# Patient Record
Sex: Male | Born: 1980 | Hispanic: Yes | Marital: Single | Smoking: Former smoker
Health system: Southern US, Community
[De-identification: ages and names within clinical notes are randomized; demographics above are authoritative.]

## PROBLEM LIST (undated history)

## (undated) DIAGNOSIS — F1010 Alcohol abuse, uncomplicated: Secondary | ICD-10-CM

## (undated) HISTORY — PX: ABDOMINAL SURGERY: SHX537

---

## 2016-03-31 ENCOUNTER — Encounter: Payer: Self-pay | Admitting: Emergency Medicine

## 2016-03-31 ENCOUNTER — Emergency Department
Admission: EM | Admit: 2016-03-31 | Discharge: 2016-03-31 | Disposition: A | Discharge status: Home / Self Care | Payer: Self-pay | Attending: Emergency Medicine | Admitting: Emergency Medicine

## 2016-03-31 DIAGNOSIS — R42 Dizziness and giddiness: Secondary | ICD-10-CM | POA: Insufficient documentation

## 2016-03-31 DIAGNOSIS — E1165 Type 2 diabetes mellitus with hyperglycemia: Secondary | ICD-10-CM | POA: Insufficient documentation

## 2016-03-31 DIAGNOSIS — R002 Palpitations: Secondary | ICD-10-CM

## 2016-03-31 DIAGNOSIS — E119 Type 2 diabetes mellitus without complications: Secondary | ICD-10-CM

## 2016-03-31 LAB — BASIC METABOLIC PANEL
ANION GAP: 9 (ref 5–15)
BUN: 7 mg/dL (ref 6–20)
CHLORIDE: 103 mmol/L (ref 101–111)
CO2: 24 mmol/L (ref 22–32)
Calcium: 9 mg/dL (ref 8.9–10.3)
Creatinine, Ser: 0.76 mg/dL (ref 0.61–1.24)
GFR calc Af Amer: >60 mL/min (ref >60)
GLUCOSE: 322 mg/dL — AB (ref 65–99)
Potassium: 3.6 mmol/L (ref 3.5–5.1)
Sodium: 136 mmol/L (ref 135–145)

## 2016-03-31 LAB — CBC
HEMATOCRIT: 46.7 % (ref 40.0–52.0)
HEMOGLOBIN: 16.3 g/dL (ref 13.0–18.0)
MCH: 30.8 pg (ref 26.0–34.0)
MCHC: 34.9 g/dL (ref 32.0–36.0)
MCV: 88.4 fL (ref 80.0–100.0)
Platelets: 225 10*3/uL (ref 150–440)
RBC: 5.28 MIL/uL (ref 4.40–5.90)
RDW: 13.2 % (ref 11.5–14.5)
WBC: 7.1 10*3/uL (ref 3.8–10.6)

## 2016-03-31 LAB — URINALYSIS COMPLETE WITH MICROSCOPIC (ARMC ONLY)
BACTERIA UA: NONE SEEN
Bilirubin Urine: NEGATIVE
HGB URINE DIPSTICK: NEGATIVE
Ketones, ur: NEGATIVE mg/dL
Leukocytes, UA: NEGATIVE
NITRITE: NEGATIVE
PH: 6 (ref 5.0–8.0)
Protein, ur: 100 mg/dL — AB
Specific Gravity, Urine: 1.032 — ABNORMAL HIGH (ref 1.005–1.030)
Squamous Epithelial / LPF: NONE SEEN

## 2016-03-31 LAB — GLUCOSE, CAPILLARY: GLUCOSE-CAPILLARY: 203 mg/dL — AB (ref 65–99)

## 2016-03-31 MED ORDER — METFORMIN HCL 500 MG PO TABS: 500.0000 mg | ORAL_TABLET | Freq: Two times a day (BID) | ORAL | Status: DC

## 2016-03-31 NOTE — ED Provider Notes (Signed)
[facility] Emergency Department Provider Note  Time seen: 6:12 PM  I have reviewed the triage vital signs and the nursing notes.   HISTORY  Chief Complaint Palpitations and Dizziness    HPI Todd Grant is a 35 y.o. male with no past medical history who presents to the emergency department palpitations. According to the patient around 3 PM today he felt like his heart was beating irregularly and got lightheaded like he might pass out. Patient denies passing out, denies any chest pain, shortness breath, nausea or sweatiness at any point. States this happened several months ago as well but he never saw anybody about it. Patient denies any symptoms upon arrival to the emergency department. States he feels back to normal.     History reviewed. No pertinent past medical history.  There are no active problems to display for this patient.   Past Surgical History  Procedure Laterality Date  . Abdominal surgery      No current outpatient prescriptions on file.  Allergies Review of patient's allergies indicates no known allergies.  No family history on file.  Social History Social History  Substance Use Topics  . Smoking status: Never Smoker   . Smokeless tobacco: None  . Alcohol Use: No     Comment: stopped 2 years ago    Review of Systems Constitutional: Negative for fever Cardiovascular: Negative for chest pain. Respiratory: Negative for shortness of breath. Gastrointestinal: Negative for abdominal pain Musculoskeletal: Negative for back pain. Neurological: Negative for headache 10-point ROS otherwise negative.  ____________________________________________   PHYSICAL EXAM:  VITAL SIGNS: ED Triage Vitals  Enc Vitals Group     BP 03/31/16 1653 163/102 mmHg     Pulse Rate 03/31/16 1653 108     Resp 03/31/16 1653 16     Temp 03/31/16 1653 98.3 F (36.8 C)     Temp Source 03/31/16 1653 Oral     SpO2 03/31/16 1653 98 %   Weight 03/31/16 1653 189 lb (85.73 kg)     Height 03/31/16 1653 5\' 7"  (1.702 m)     Head Cir --      Peak Flow --      Pain Score 03/31/16 1700 0     Pain Loc --      Pain Edu? --      Excl. in GC? --    Constitutional: Alert and oriented. Well appearing and in no distress. Eyes: Normal exam ENT   Head: Normocephalic and atraumatic.   Mouth/Throat: Mucous membranes are moist. Cardiovascular: Normal rate, regular rhythm. No murmur Respiratory: Normal respiratory effort without tachypnea nor retractions. Breath sounds are clear  Gastrointestinal: Soft and nontender. No distention.  Musculoskeletal: Nontender with normal range of motion in all extremities.  Neurologic:  Normal speech and language. No gross focal neurologic deficits Skin:  Skin is warm, dry and intact.  Psychiatric: Mood and affect are normal. Speech and behavior are normal. ____________________________________________    EKG  EKG reviewed and interpreted by myself shows normal sinus rhythm at 95 bpm, narrow QRS, normal axis, normal intervals, no ST changes. Overall normal EKG.  ____________________________________________   INITIAL IMPRESSION / ASSESSMENT AND PLAN / ED COURSE  Pertinent labs & imaging results that were available during my care of the patient were reviewed by me and considered in my medical decision making (see chart for details).  Patient presents the emergency department with palpitations. Patient's lab work is largely within normal limits besides his blood glucose being elevated.  Patient states he was drinking Pepsi shortly before they checked his blood sugar. However given the patient's palpitations with a significantly elevated blood glucose of 322 have added on a hemoglobin A1c to help rule out diabetes. Patient is agreeable to this plan.  Have added on a hemoglobin A1c. Patient's repeat fingerstick 2 hours or more after last time he took a sip of Pepsi's 203 consistent with diabetes. We  will start the patient on a low-dose metformin, and have him follow up with primary care physician. Patient is agreeable to plan.   ____________________________________________   FINAL CLINICAL IMPRESSION(S) / ED DIAGNOSES  Diabetes hyperglycemia  Minna Antis, MD 03/31/16 2017

## 2016-03-31 NOTE — Discharge Instructions (Signed)
Hyperglycemia °Hyperglycemia occurs when the glucose (sugar) in your blood is too high. Hyperglycemia can happen for many reasons, but it most often happens to people who do not know they have diabetes or are not managing their diabetes properly.  °CAUSES  °Whether you have diabetes or not, there are other causes of hyperglycemia. Hyperglycemia can occur when you have diabetes, but it can also occur in other situations that you might not be as aware of, such as: °Diabetes °· If you have diabetes and are having problems controlling your blood glucose, hyperglycemia could occur because of some of the following reasons: °¨ Not following your meal plan. °¨ Not taking your diabetes medications or not taking it properly. °¨ Exercising less or doing less activity than you normally do. °¨ Being sick. °Pre-diabetes °· This cannot be ignored. Before people develop Type 2 diabetes, they almost always have "pre-diabetes." This is when your blood glucose levels are higher than normal, but not yet high enough to be diagnosed as diabetes. Research has shown that some long-term damage to the body, especially the heart and circulatory system, may already be occurring during pre-diabetes. If you take action to manage your blood glucose when you have pre-diabetes, you may delay or prevent Type 2 diabetes from developing. °Stress °· If you have diabetes, you may be "diet" controlled or on oral medications or insulin to control your diabetes. However, you may find that your blood glucose is higher than usual in the hospital whether you have diabetes or not. This is often referred to as "stress hyperglycemia." Stress can elevate your blood glucose. This happens because of hormones put out by the body during times of stress. If stress has been the cause of your high blood glucose, it can be followed regularly by your caregiver. That way he/she can make sure your hyperglycemia does not continue to get worse or progress to  diabetes. °Steroids °· Steroids are medications that act on the infection fighting system (immune system) to block inflammation or infection. One side effect can be a rise in blood glucose. Most people can produce enough extra insulin to allow for this rise, but for those who cannot, steroids make blood glucose levels go even higher. It is not unusual for steroid treatments to "uncover" diabetes that is developing. It is not always possible to determine if the hyperglycemia will go away after the steroids are stopped. A special blood test called an A1c is sometimes done to determine if your blood glucose was elevated before the steroids were started. °SYMPTOMS °· Thirsty. °· Frequent urination. °· Dry mouth. °· Blurred vision. °· Tired or fatigue. °· Weakness. °· Sleepy. °· Tingling in feet or leg. °DIAGNOSIS  °Diagnosis is made by monitoring blood glucose in one or all of the following ways: °· A1c test. This is a chemical found in your blood. °· Fingerstick blood glucose monitoring. °· Laboratory results. °TREATMENT  °First, knowing the cause of the hyperglycemia is important before the hyperglycemia can be treated. Treatment may include, but is not be limited to: °· Education. °· Change or adjustment in medications. °· Change or adjustment in meal plan. °· Treatment for an illness, infection, etc. °· More frequent blood glucose monitoring. °· Change in exercise plan. °· Decreasing or stopping steroids. °· Lifestyle changes. °HOME CARE INSTRUCTIONS  °· Test your blood glucose as directed. °· Exercise regularly. Your caregiver will give you instructions about exercise. Pre-diabetes or diabetes which comes on with stress is helped by exercising. °· Eat wholesome,   balanced meals. Eat often and at regular, fixed times. Your caregiver or nutritionist will give you a meal plan to guide your sugar intake.  Being at an ideal weight is important. If needed, losing as little as 10 to 15 pounds may help improve blood  glucose levels. SEEK MEDICAL CARE IF:   You have questions about medicine, activity, or diet.  You continue to have symptoms (problems such as increased thirst, urination, or weight gain). SEEK IMMEDIATE MEDICAL CARE IF:   You are vomiting or have diarrhea.  Your breath smells fruity.  You are breathing faster or slower.  You are very sleepy or incoherent.  You have numbness, tingling, or pain in your feet or hands.  You have chest pain.  Your symptoms get worse even though you have been following your caregiver's orders.  If you have any other questions or concerns.   This information is not intended to replace advice given to you by your health care provider. Make sure you discuss any questions you have with your health care provider.   Document Released: 05/11/2001 Document Revised: 02/07/2012 Document Reviewed: 07/22/2015 Elsevier Interactive Patient Education 2016 [employer]  Palpitations A palpitation is the feeling that your heartbeat is irregular. It may feel like your heart is fluttering or skipping a beat. It may also feel like your heart is beating faster than normal. This is usually not a serious problem. In some cases, you may need more medical tests. HOME CARE  Avoid:  Caffeine in coffee, tea, soft drinks, diet pills, and energy drinks.  Chocolate.  Alcohol.  Stop smoking if you smoke.  Reduce your stress and anxiety. Try:  A method that measures bodily functions so you can learn to control them (biofeedback).  Yoga.  Meditation.  Physical activity such as swimming, jogging, or walking.  Get plenty of rest and sleep. GET HELP IF:  Your fast or irregular heartbeat continues after 24 hours.  Your palpitations occur more often. GET HELP RIGHT AWAY IF:   You have chest pain.  You feel short of breath.  You have a very bad headache.  You feel dizzy or pass out (faint). MAKE SURE YOU:   Understand these instructions.  Will watch your  condition.  Will get help right away if you are not doing well or get worse.   This information is not intended to replace advice given to you by your health care provider. Make sure you discuss any questions you have with your health care provider.   Document Released: 08/24/2008 Document Revised: 12/06/2014 Document Reviewed: 01/14/2012 Elsevier Interactive Patient Education [employer] Inc[employer]

## 2016-03-31 NOTE — ED Notes (Signed)
Patient presents to the ED with palpitations and feeling lightheaded and dizzy around 3pm.  Patient states, "I felt like I was going to pass out."  Patient reports continuing to feel slightly dizzy but states the palpitations have resolved.  Patient ambulatory to triage with no obvious distress.  Patient denies chest pain and shortness of breath.

## 2016-04-01 LAB — HEMOGLOBIN A1C: Hgb A1c MFr Bld: 10 % — ABNORMAL HIGH (ref 4.0–6.0)

## 2019-04-23 ENCOUNTER — Emergency Department
Admission: EM | Admit: 2019-04-23 | Discharge: 2019-04-24 | Disposition: A | Discharge status: Home / Self Care | Payer: Self-pay | Attending: Emergency Medicine | Admitting: Emergency Medicine

## 2019-04-23 ENCOUNTER — Encounter: Payer: Self-pay | Admitting: Emergency Medicine

## 2019-04-23 ENCOUNTER — Other Ambulatory Visit: Payer: Self-pay

## 2019-04-23 DIAGNOSIS — Z87891 Personal history of nicotine dependence: Secondary | ICD-10-CM | POA: Insufficient documentation

## 2019-04-23 DIAGNOSIS — F1010 Alcohol abuse, uncomplicated: Secondary | ICD-10-CM | POA: Insufficient documentation

## 2019-04-23 DIAGNOSIS — E119 Type 2 diabetes mellitus without complications: Secondary | ICD-10-CM | POA: Insufficient documentation

## 2019-04-23 LAB — CBC
HCT: 44.1 % (ref 39.0–52.0)
Hemoglobin: 16.1 g/dL (ref 13.0–17.0)
MCH: 33 pg (ref 26.0–34.0)
MCHC: 36.5 g/dL — ABNORMAL HIGH (ref 30.0–36.0)
MCV: 90.4 fL (ref 80.0–100.0)
Platelets: 137 10*3/uL — ABNORMAL LOW (ref 150–400)
RBC: 4.88 MIL/uL (ref 4.22–5.81)
RDW: 11.8 % (ref 11.5–15.5)
WBC: 4 10*3/uL (ref 4.0–10.5)
nRBC: 0 % (ref 0.0–0.2)

## 2019-04-23 LAB — URINE DRUG SCREEN, QUALITATIVE (ARMC ONLY)
Amphetamines, Ur Screen: NOT DETECTED
Barbiturates, Ur Screen: NOT DETECTED
Benzodiazepine, Ur Scrn: NOT DETECTED
Cannabinoid 50 Ng, Ur ~~LOC~~: NOT DETECTED
Cocaine Metabolite,Ur ~~LOC~~: NOT DETECTED
MDMA (Ecstasy)Ur Screen: NOT DETECTED
Methadone Scn, Ur: NOT DETECTED
Opiate, Ur Screen: NOT DETECTED
Phencyclidine (PCP) Ur S: NOT DETECTED
Tricyclic, Ur Screen: NOT DETECTED

## 2019-04-23 LAB — BASIC METABOLIC PANEL
Anion gap: 16 — ABNORMAL HIGH (ref 5–15)
BUN: <5 mg/dL — ABNORMAL LOW (ref 6–20)
CO2: 24 mmol/L (ref 22–32)
Calcium: 9.5 mg/dL (ref 8.9–10.3)
Chloride: 96 mmol/L — ABNORMAL LOW (ref 98–111)
Creatinine, Ser: 0.4 mg/dL — ABNORMAL LOW (ref 0.61–1.24)
GFR calc Af Amer: >60 mL/min (ref >60)
GFR calc non Af Amer: >60 mL/min (ref >60)
Glucose, Bld: 203 mg/dL — ABNORMAL HIGH (ref 70–99)
Potassium: 3.6 mmol/L (ref 3.5–5.1)
Sodium: 136 mmol/L (ref 135–145)

## 2019-04-23 LAB — GLUCOSE, CAPILLARY: Glucose-Capillary: 223 mg/dL — ABNORMAL HIGH (ref 70–99)

## 2019-04-23 LAB — ETHANOL: Alcohol, Ethyl (B): 334 mg/dL (ref <10)

## 2019-04-23 NOTE — ED Triage Notes (Signed)
Pt requesting detox for alcohol. Pt drinks approx. 16 beers per day. Last drink 2 hours ago. Pt sts, "I just feel bad." Pt informed we do not offer detox but will provide resources for him by TTS.

## 2019-04-23 NOTE — ED Notes (Signed)
Hourly rounding reveals patient in room. No complaints, stable, in no acute distress. Q15 minute rounds and monitoring via Rover and Officer to continue.   

## 2019-04-23 NOTE — ED Notes (Signed)
Pt. Transferred from Triage to room after dressing out and screening for contraband. Report to include Situation, Background, Assessment and Recommendations from triage RN. Pt. Oriented to Quad including Q15 minute rounds as well as Psychologist, counselling for their protection. Patient is alert and oriented, warm and dry in no acute distress. Patient denies SI, HI, and AVH. Patient is here for detox. Pt. Encouraged to let me know if needs arise.

## 2019-04-24 ENCOUNTER — Encounter: Payer: Self-pay | Admitting: Emergency Medicine

## 2019-04-24 LAB — HEPATIC FUNCTION PANEL
ALT: 141 U/L — ABNORMAL HIGH (ref 0–44)
AST: 172 U/L — ABNORMAL HIGH (ref 15–41)
Albumin: 4.6 g/dL (ref 3.5–5.0)
Alkaline Phosphatase: 110 U/L (ref 38–126)
Bilirubin, Direct: 0.5 mg/dL — ABNORMAL HIGH (ref 0.0–0.2)
Indirect Bilirubin: 0.8 mg/dL (ref 0.3–0.9)
Total Bilirubin: 1.3 mg/dL — ABNORMAL HIGH (ref 0.3–1.2)
Total Protein: 8.7 g/dL — ABNORMAL HIGH (ref 6.5–8.1)

## 2019-04-24 NOTE — ED Provider Notes (Signed)
[facility] Emergency Department Provider Note   ____________________________________________   First MD Initiated Contact with Patient 04/23/19 2351     (approximate)  I have reviewed the triage vital signs and the nursing notes.   HISTORY  Chief Complaint Alcohol Problem    HPI Todd Grant is a 38 y.o. male patient reports he wants help getting off of alcohol.  Drinks about 16 beers a day last was 2 hours ago.  He says he feels bad.  TTS came by to check on his paperwork and found out he is not taking his diabetes medicines.  RTS will not take him for that reason.  TTS is working on other placement possibilities     History reviewed. No pertinent past medical history.  There are no active problems to display for this patient.   Past Surgical History:  Procedure Laterality Date  . ABDOMINAL SURGERY      Prior to Admission medications   Not on File    Allergies Patient has no known allergies.  History reviewed. No pertinent family history.  Social History Social History   Tobacco Use  . Smoking status: Former Games developer  . Smokeless tobacco: Never Used  Substance Use Topics  . Alcohol use: Yes    Alcohol/week: 16.0 standard drinks    Types: 16 Cans of beer per week  . Drug use: Not on file    Review of Systems  Constitutional: No fever/chills Eyes: No visual changes. ENT: No sore throat. Cardiovascular: Denies chest pain. Respiratory: Denies shortness of breath. Gastrointestinal: No abdominal pain.  No nausea, no vomiting.  No diarrhea.  No constipation. Genitourinary: Negative for dysuria. Musculoskeletal: Negative for back pain. Skin: Negative for rash. Neurological: Negative for headaches, focal weakness   ____________________________________________   PHYSICAL EXAM:  VITAL SIGNS: ED Triage Vitals  Enc Vitals Group     BP 04/23/19 2210 (!) 172/122     Pulse Rate 04/23/19 2210 (!) 120     Resp 04/23/19  2210 (!) 21     Temp 04/23/19 2210 97.9 F (36.6 C)     Temp Source 04/23/19 2210 Oral     SpO2 04/23/19 2210 98 %     Weight --      Height --      Head Circumference --      Peak Flow --      Pain Score 04/23/19 2226 0     Pain Loc --      Pain Edu? --      Excl. in GC? --     Constitutional: Alert and oriented. Well appearing and in no acute distress. Eyes: Conjunctivae are normal.  Head: Atraumatic. Nose: No congestion/rhinnorhea. Mouth/Throat: Mucous membranes are moist.  Oropharynx non-erythematous. Neck: No stridor.   Cardiovascular: Normal rate, regular rhythm. Grossly normal heart sounds.  Good peripheral circulation. Respiratory: Normal respiratory effort.  No retractions. Lungs CTAB. Gastrointestinal: Soft and nontender. No distention. No abdominal bruits. No CVA tenderness. Musculoskeletal: No lower extremity tenderness nor edema.  Neurologic:  Normal speech and language. No gross focal neurologic deficits are appreciated. Skin:  Skin is warm, dry and intact. No rash noted. Psychiatric: Mood and affect are normal. Speech and behavior are normal.  ____________________________________________   LABS (all labs ordered are listed, but only abnormal results are displayed)  Labs Reviewed  GLUCOSE, CAPILLARY - Abnormal; Notable for the following components:      Result Value   Glucose-Capillary 223 (*)    All  other components within normal limits  CBC - Abnormal; Notable for the following components:   MCHC 36.5 (*)    Platelets 137 (*)    All other components within normal limits  BASIC METABOLIC PANEL - Abnormal; Notable for the following components:   Chloride 96 (*)    Glucose, Bld 203 (*)    BUN <5 (*)    Creatinine, Ser 0.40 (*)    Anion gap 16 (*)    All other components within normal limits  ETHANOL - Abnormal; Notable for the following components:   Alcohol, Ethyl (B) 334 (*)    All other components within normal limits  HEPATIC FUNCTION PANEL -  Abnormal; Notable for the following components:   Total Protein 8.7 (*)    AST 172 (*)    ALT 141 (*)    Total Bilirubin 1.3 (*)    Bilirubin, Direct 0.5 (*)    All other components within normal limits  URINE DRUG SCREEN, QUALITATIVE ([facility] ONLY)   ____________________________________________  EKG  ____________________________________________  RADIOLOGY  ED MD interpretation:    Official radiology report(s): No results found.  ____________________________________________   PROCEDURES  Procedure(s) performed (including Critical Care):  Procedures   ____________________________________________   INITIAL IMPRESSION / ASSESSMENT AND PLAN / ED COURSE  Patient no longer wants to wait.  I explained to him about RTS.  TTS is coming by to give him information about [facility] which is where they were going to try and put him but they could not do it till the morning.  He is sober appearing although his alcohol level is very high.  He understands he will need to get a ride home.  We will give him information about [facility] and let him go.              ____________________________________________   FINAL CLINICAL IMPRESSION(S) / ED DIAGNOSES  Final diagnoses:  Alcohol abuse     ED Discharge Orders    None       Note:  This document was prepared using Dragon voice recognition software and may include unintentional dictation errors.    Arnaldo NatalMalinda, Sayda Grable F, MD 04/24/19 [phone]

## 2019-04-24 NOTE — ED Notes (Signed)
Hourly rounding reveals patient in room. No complaints, stable, in no acute distress. Q15 minute rounds and monitoring via Rover and Officer to continue.   

## 2019-04-24 NOTE — Discharge Instructions (Addendum)
Please call [facility] and see if they can help you.  Their information is on the other sheets of paper that we gave you.  Please return for any further problems.

## 2020-06-05 ENCOUNTER — Telehealth: Payer: Self-pay

## 2020-06-05 NOTE — Telephone Encounter (Signed)
Called [facility]mom informing patient of appointment on 06/09/2020. klh 

## 2020-06-09 ENCOUNTER — Ambulatory Visit: Payer: Self-pay | Admitting: Adult Health

## 2020-06-09 ENCOUNTER — Other Ambulatory Visit: Payer: Self-pay

## 2020-06-09 VITALS — BP 141/88 | HR 79 | Temp 97.7°F | Resp 16 | Ht 66.0 in | Wt 152.8 lb

## 2020-06-09 DIAGNOSIS — Z7689 Persons encountering health services in other specified circumstances: Secondary | ICD-10-CM

## 2020-06-09 DIAGNOSIS — E119 Type 2 diabetes mellitus without complications: Secondary | ICD-10-CM

## 2020-06-09 DIAGNOSIS — F320 Major depressive disorder, single episode, mild: Secondary | ICD-10-CM

## 2020-06-09 LAB — POCT GLYCOSYLATED HEMOGLOBIN (HGB A1C): Hemoglobin A1C: 8.1 % — AB (ref 4.0–5.6)

## 2020-06-09 MED ORDER — BUPROPION HCL ER (XL) 150 MG PO TB24: 150.0000 mg | ORAL_TABLET | Freq: Every day | ORAL | 0 refills | Status: AC

## 2020-06-09 MED ORDER — GLIMEPIRIDE 2 MG PO TABS: 2.0000 mg | ORAL_TABLET | Freq: Every day | ORAL | 0 refills | Status: AC

## 2020-06-09 NOTE — Progress Notes (Signed)
[facility] [address] [place], [place] [address]  Internal MEDICINE  Office Visit Note  Patient Name: Todd Grant  [id number]  [id number]  Date of Service: 07/10/2020   Complaints/HPI Pt is here for establishment of PCP. Chief Complaint  Patient presents with  . New Patient (Initial Visit)    per Todd Grant drew clinic they diagnosed pt with diabetes, needs new medication  . Depression    needs new medication for issues   HPI Pt is here to establish care.  He is a well appearing 39 yo Hispanic male.  He currently works as a Education administrator.  He was told in 2017 he has DMII, and has not been treated.  He did stop drinking monster energy drinks, and stopped adding sugar to his cornflakes.  However, he has not followed up with anyone since.    Current Medication: Outpatient Encounter Medications as of 06/09/2020  Medication Sig  . buPROPion (WELLBUTRIN XL) 150 MG 24 hr tablet Take 1 tablet (150 mg total) by mouth daily.  Marland Kitchen glimepiride (AMARYL) 2 MG tablet Take 1 tablet (2 mg total) by mouth daily before breakfast.   No facility-administered encounter medications on file as of 06/09/2020.    Surgical History: Past Surgical History:  Procedure Laterality Date  . ABDOMINAL SURGERY      Medical History: No past medical history on file.  Family History: No family history on file.  Social History   Socioeconomic History  . Marital status: Single    Spouse name: Not on file  . Number of children: Not on file  . Years of education: Not on file  . Highest education level: Not on file  Occupational History  . Not on file  Tobacco Use  . Smoking status: Former Games developer  . Smokeless tobacco: Never Used  Substance and Sexual Activity  . Alcohol use: Yes    Alcohol/week: 16.0 standard drinks    Types: 16 Cans of beer per week  . Drug use: Not on file  . Sexual activity: Not on file  Other Topics Concern  . Not on file  Social History Narrative  . Not on  file   Social Determinants of Health   Financial Resource Strain:   . Difficulty of Paying Living Expenses:   Food Insecurity:   . Worried About Programme researcher, broadcasting/film/video in the Last Year:   . Barista in the Last Year:   Transportation Needs:   . Freight forwarder (Medical):   Marland Kitchen Lack of Transportation (Non-Medical):   Physical Activity:   . Days of Exercise per Week:   . Minutes of Exercise per Session:   Stress:   . Feeling of Stress :   Social Connections:   . Frequency of Communication with Friends and Family:   . Frequency of Social Gatherings with Friends and Family:   . Attends Religious Services:   . Active Member of Clubs or Organizations:   . Attends Banker Meetings:   Marland Kitchen Marital Status:   Intimate Partner Violence:   . Fear of Current or Ex-Partner:   . Emotionally Abused:   Marland Kitchen Physically Abused:   . Sexually Abused:      Review of Systems  Constitutional: Negative.  Negative for chills, fatigue and unexpected weight change.  HENT: Negative.  Negative for congestion, rhinorrhea, sneezing and sore throat.   Eyes: Negative for redness.  Respiratory: Negative.  Negative for cough, chest tightness and shortness of breath.  Cardiovascular: Negative.  Negative for chest pain and palpitations.  Gastrointestinal: Negative.  Negative for abdominal pain, constipation, diarrhea, nausea and vomiting.  Endocrine: Negative.   Genitourinary: Negative.  Negative for dysuria and frequency.  Musculoskeletal: Negative.  Negative for arthralgias, back pain, joint swelling and neck pain.  Skin: Negative.  Negative for rash.  Allergic/Immunologic: Negative.   Neurological: Negative.  Negative for tremors and numbness.  Hematological: Negative for adenopathy. Does not bruise/bleed easily.  Psychiatric/Behavioral: Negative.  Negative for behavioral problems, sleep disturbance and suicidal ideas. The patient is not nervous/anxious.     Vital Signs: BP (!) 141/88    Pulse 79   Temp 97.7 F (36.5 C)   Resp 16   Ht 5\' 6"  (1.676 m)   Wt 152 lb 12.8 oz (69.3 kg)   SpO2 98%   BMI 24.66 kg/m    Physical Exam Vitals and nursing note reviewed.  Constitutional:      General: He is not in acute distress.    Appearance: He is well-developed. He is not diaphoretic.  HENT:     Head: Normocephalic and atraumatic.     Mouth/Throat:     Pharynx: No oropharyngeal exudate.  Eyes:     Pupils: Pupils are equal, round, and reactive to light.  Neck:     Thyroid: No thyromegaly.     Vascular: No JVD.     Trachea: No tracheal deviation.  Cardiovascular:     Rate and Rhythm: Normal rate and regular rhythm.     Heart sounds: Normal heart sounds. No murmur heard.  No friction rub. No gallop.   Pulmonary:     Effort: Pulmonary effort is normal. No respiratory distress.     Breath sounds: Normal breath sounds. No wheezing or rales.  Chest:     Chest wall: No tenderness.  Abdominal:     Palpations: Abdomen is soft.     Tenderness: There is no abdominal tenderness. There is no guarding.  Musculoskeletal:        General: Normal range of motion.     Cervical back: Normal range of motion and neck supple.  Lymphadenopathy:     Cervical: No cervical adenopathy.  Skin:    General: Skin is warm and dry.  Neurological:     Mental Status: He is alert and oriented to person, place, and time.     Cranial Nerves: No cranial nerve deficit.  Psychiatric:        Behavior: Behavior normal.        Thought Content: Thought content normal.        Judgment: Judgment normal.    Assessment/Plan:  1. Encounter to establish care with new doctor Labs given to patient on paper, indigent lab slip.   2. Diabetes mellitus without complication (HCC) A1C today is 8.1.  Discussed lifestyle modifications and will start Amaryl 2mg  daily.  Follow up in 3 months for recheck.  - POCT HgB A1C  3. Depression, major, single episode, mild (HCC) Start wellbutrin as discussed.  Follow  up in 4 weeks for recheck.   General Counseling: Todd Grant verbalizes understanding of the findings of todays visit and agrees with plan of treatment. I have discussed any further diagnostic evaluation that may be needed or ordered today. We also reviewed his medications today. he has been encouraged to call the office with any questions or concerns that should arise related to todays visit.  Orders Placed This Encounter  Procedures  . POCT HgB A1C    Meds  ordered this encounter  Medications  . glimepiride (AMARYL) 2 MG tablet    Sig: Take 1 tablet (2 mg total) by mouth daily before breakfast.    Dispense:  90 tablet    Refill:  0  . buPROPion (WELLBUTRIN XL) 150 MG 24 hr tablet    Sig: Take 1 tablet (150 mg total) by mouth daily.    Dispense:  90 tablet    Refill:  0    Patient has good[employer] cared BIN [id number], PCN [id number], group [id number], member id [id number]    Time spent: 30 Minutes   This patient was seen by Blima Ledger AGNP-C in Collaboration with Dr Lyndon Code as a part of collaborative care agreement  Johnna Acosta AGNP-C Internal Medicine

## 2020-07-08 ENCOUNTER — Telehealth: Payer: Self-pay

## 2020-07-08 NOTE — Telephone Encounter (Signed)
Confirmed office visit for 8/12 

## 2020-07-10 ENCOUNTER — Ambulatory Visit: Payer: Self-pay | Admitting: Hospice and Palliative Medicine

## 2021-06-18 ENCOUNTER — Other Ambulatory Visit: Payer: Self-pay

## 2021-06-18 ENCOUNTER — Encounter: Payer: Self-pay | Admitting: Emergency Medicine

## 2021-06-18 ENCOUNTER — Emergency Department: Payer: Self-pay

## 2021-06-18 ENCOUNTER — Emergency Department
Admission: EM | Admit: 2021-06-18 | Discharge: 2021-06-19 | Disposition: A | Discharge status: Home / Self Care | Payer: Self-pay | Attending: Emergency Medicine | Admitting: Emergency Medicine

## 2021-06-18 DIAGNOSIS — R112 Nausea with vomiting, unspecified: Secondary | ICD-10-CM | POA: Insufficient documentation

## 2021-06-18 DIAGNOSIS — F1010 Alcohol abuse, uncomplicated: Secondary | ICD-10-CM | POA: Insufficient documentation

## 2021-06-18 DIAGNOSIS — Z87891 Personal history of nicotine dependence: Secondary | ICD-10-CM | POA: Insufficient documentation

## 2021-06-18 DIAGNOSIS — Y908 Blood alcohol level of 240 mg/100 ml or more: Secondary | ICD-10-CM | POA: Insufficient documentation

## 2021-06-18 DIAGNOSIS — I10 Essential (primary) hypertension: Secondary | ICD-10-CM

## 2021-06-18 DIAGNOSIS — R4182 Altered mental status, unspecified: Secondary | ICD-10-CM | POA: Insufficient documentation

## 2021-06-18 DIAGNOSIS — Z20822 Contact with and (suspected) exposure to covid-19: Secondary | ICD-10-CM | POA: Insufficient documentation

## 2021-06-18 HISTORY — DX: Alcohol abuse, uncomplicated: F10.10

## 2021-06-18 LAB — TYPE AND SCREEN
ABO/RH(D): O POS
Antibody Screen: NEGATIVE

## 2021-06-18 LAB — COMPREHENSIVE METABOLIC PANEL
ALT: 40 U/L (ref 0–44)
AST: 97 U/L — ABNORMAL HIGH (ref 15–41)
Albumin: 4 g/dL (ref 3.5–5.0)
Alkaline Phosphatase: 146 U/L — ABNORMAL HIGH (ref 38–126)
Anion gap: 14 (ref 5–15)
BUN: <5 mg/dL — ABNORMAL LOW (ref 6–20)
CO2: 24 mmol/L (ref 22–32)
Calcium: 8.8 mg/dL — ABNORMAL LOW (ref 8.9–10.3)
Chloride: 97 mmol/L — ABNORMAL LOW (ref 98–111)
Creatinine, Ser: 0.4 mg/dL — ABNORMAL LOW (ref 0.61–1.24)
GFR, Estimated: >60 mL/min (ref >60)
Glucose, Bld: 195 mg/dL — ABNORMAL HIGH (ref 70–99)
Potassium: 3.3 mmol/L — ABNORMAL LOW (ref 3.5–5.1)
Sodium: 135 mmol/L (ref 135–145)
Total Bilirubin: 3 mg/dL — ABNORMAL HIGH (ref 0.3–1.2)
Total Protein: 8.6 g/dL — ABNORMAL HIGH (ref 6.5–8.1)

## 2021-06-18 LAB — LIPASE, BLOOD: Lipase: 131 U/L — ABNORMAL HIGH (ref 11–51)

## 2021-06-18 LAB — TROPONIN I (HIGH SENSITIVITY): Troponin I (High Sensitivity): 6 ng/L (ref <18)

## 2021-06-18 LAB — MAGNESIUM: Magnesium: 1.7 mg/dL (ref 1.7–2.4)

## 2021-06-18 LAB — ETHANOL: Alcohol, Ethyl (B): 417 mg/dL (ref <10)

## 2021-06-18 LAB — CBG MONITORING, ED: Glucose-Capillary: 205 mg/dL — ABNORMAL HIGH (ref 70–99)

## 2021-06-18 MED ORDER — OCTREOTIDE LOAD VIA INFUSION
50.0000 ug | Freq: Once | INTRAVENOUS | Status: DC
  Filled 2021-06-18: qty 25

## 2021-06-18 MED ORDER — SODIUM CHLORIDE 0.9 % IV BOLUS
1000.0000 mL | Freq: Once | INTRAVENOUS | Status: AC
  Administered 2021-06-18: 1000 mL via INTRAVENOUS

## 2021-06-18 MED ORDER — SODIUM CHLORIDE 0.9 % IV SOLN
50.0000 ug/h | INTRAVENOUS | Status: DC
  Filled 2021-06-18 (×2): qty 1

## 2021-06-18 MED ORDER — PANTOPRAZOLE SODIUM 40 MG IV SOLR
40.0000 mg | Freq: Once | INTRAVENOUS | Status: AC
  Administered 2021-06-18: 40 mg via INTRAVENOUS
  Filled 2021-06-18: qty 40

## 2021-06-18 MED ORDER — ONDANSETRON HCL 4 MG/2ML IJ SOLN
4.0000 mg | Freq: Once | INTRAMUSCULAR | Status: AC
  Administered 2021-06-18: 4 mg via INTRAVENOUS
  Filled 2021-06-18: qty 2

## 2021-06-18 MED ORDER — IOHEXOL 350 MG/ML SOLN
100.0000 mL | Freq: Once | INTRAVENOUS | Status: AC | PRN
  Administered 2021-06-18: 100 mL via INTRAVENOUS

## 2021-06-18 NOTE — ED Notes (Signed)
PT in CT.

## 2021-06-18 NOTE — ED Notes (Signed)
Pt provided a patient phone to call his family.

## 2021-06-18 NOTE — ED Triage Notes (Signed)
Pt in via POV, reports here for detox, also reports hematemesis x approximately one week.  Reports last drink approximately 2 hours ago, reports only having 2 24oz beers today.  Pt with difficulty staying awake in triage.  Sinus Tach, hypertensive upon arrival.

## 2021-06-18 NOTE — ED Provider Notes (Addendum)
[facility] Emergency Department Provider Note  ____________________________________________   Event Date/Time   First MD Initiated Contact with Patient 06/18/21 2227     (approximate)  I have reviewed the triage vital signs and the nursing notes.   HISTORY  Chief Complaint Hematemesis    HPI Todd Grant is a 40 y.o. male with alcohol abuse who comes in with concerns for detox.  Patient reports that he started having vomiting o1 week ago.  Initially that there was blood in it.  Intermittent, unclear how much, brought on by drinking, nothing made it better. States that he still had some vomiting but denies any blood currently.  Reports the last drink 2 hours ago.  States that he does drink daily.  He states that he has never had seizures with withdrawal.  Denies any SI.  He denies any falls.  Denies any abdominal pain.       Past Medical History:  Diagnosis Date   ETOH abuse     There are no problems to display for this patient.   Past Surgical History:  Procedure Laterality Date   ABDOMINAL SURGERY      Prior to Admission medications   Medication Sig Start Date End Date Taking? Authorizing Provider  buPROPion (WELLBUTRIN XL) 150 MG 24 hr tablet Take 1 tablet (150 mg total) by mouth daily. 06/09/20   Johnna Acosta, NP  glimepiride (AMARYL) 2 MG tablet Take 1 tablet (2 mg total) by mouth daily before breakfast. 06/09/20   Johnna Acosta, NP    Allergies Patient has no known allergies.  No family history on file.  Social History Social History   Tobacco Use   Smoking status: Former    Types: Cigarettes   Smokeless tobacco: Never  Vaping Use   Vaping Use: Never used  Substance Use Topics   Alcohol use: Yes    Alcohol/week: 16.0 standard drinks    Types: 16 Cans of beer per week   Drug use: Never      Review of Systems Constitutional: No fever/chills +alcohol use  Eyes: No visual changes. ENT: No sore  throat. Cardiovascular: Denies chest pain. Respiratory: Denies shortness of breath. Gastrointestinal: +vomiting fluid +nausea  Genitourinary: Negative for dysuria. Musculoskeletal: Negative for back pain. Skin: Negative for rash. Neurological: Negative for headaches, focal weakness or numbness. All other ROS negative ____________________________________________   PHYSICAL EXAM:  VITAL SIGNS: ED Triage Vitals  Enc Vitals Group     BP 06/18/21 2145 (!) 151/113     Pulse Rate 06/18/21 2145 (!) 126     Resp 06/18/21 2145 16     Temp 06/18/21 2145 99 F (37.2 C)     Temp src --      SpO2 06/18/21 2145 94 %     Weight 06/18/21 2151 155 lb (70.3 kg)     Height 06/18/21 2151 5' 1.42" (1.56 m)     Head Circumference --      Peak Flow --      Pain Score 06/18/21 2151 0     Pain Loc --      Pain Edu? --      Excl. in GC? --     Constitutional: Slightly slurred speech.  Appears intoxicated Eyes: Conjunctivae are normal. EOMI. Head: Atraumatic. Nose: No congestion/rhinnorhea. Mouth/Throat: Mucous membranes are moist.   Neck: No stridor. Trachea Midline. FROM Cardiovascular: tachycardiac, regular rhythm. Grossly normal heart sounds.  Good peripheral circulation. Respiratory: Normal respiratory effort.  No  retractions. Lungs CTAB. Gastrointestinal: Soft and nontender. No distention. No abdominal bruits.  Musculoskeletal: No lower extremity tenderness nor edema.  No joint effusions. Neurologic:  Normal speech and language. No gross focal neurologic deficits are appreciated.  Skin:  Skin is warm, dry and intact. No rash noted. Psychiatric: Mood and affect are normal. Speech and behavior are normal. GU: Deferred   ____________________________________________   LABS (all labs ordered are listed, but only abnormal results are displayed)  Labs Reviewed  ETHANOL - Abnormal; Notable for the following components:      Result Value   Alcohol, Ethyl (B) 417 (*)    All other components  within normal limits  COMPREHENSIVE METABOLIC PANEL - Abnormal; Notable for the following components:   Potassium 3.3 (*)    Chloride 97 (*)    Glucose, Bld 195 (*)    BUN <5 (*)    Creatinine, Ser 0.40 (*)    Calcium 8.8 (*)    Total Protein 8.6 (*)    AST 97 (*)    Alkaline Phosphatase 146 (*)    Total Bilirubin 3.0 (*)    All other components within normal limits  LIPASE, BLOOD - Abnormal; Notable for the following components:   Lipase 131 (*)    All other components within normal limits  CBC WITH DIFFERENTIAL/PLATELET  URINALYSIS, COMPLETE (UACMP) WITH MICROSCOPIC  CBG MONITORING, ED  TYPE AND SCREEN  TROPONIN I (HIGH SENSITIVITY)   ____________________________________________   ED ECG REPORT I, Concha Se, the attending physician, personally viewed and interpreted this ECG.  Sinus tachycardia rate of 123, no ST elevation, T wave version lead III, normal intervals ____________________________________________  RADIOLOGY Vela Prose, personally viewed and evaluated these images (plain radiographs) as part of my medical decision making, as well as reviewing the written report by the radiologist.  ED MD interpretation:  pending   Official radiology report(s): No results found.  ____________________________________________   PROCEDURES  Procedure(s) performed (including Critical Care):  .1-3 Lead EKG Interpretation  Date/Time: 06/18/2021 10:51 PM Performed by: Concha Se, MD Authorized by: Concha Se, MD     Interpretation: abnormal     ECG rate:  120s   ECG rate assessment: tachycardic     Rhythm: sinus rhythm     Ectopy: none     Conduction: normal     ____________________________________________   INITIAL IMPRESSION / ASSESSMENT AND PLAN / ED COURSE  Todd Grant was evaluated in Emergency Department on 06/18/2021 for the symptoms described in the history of present illness. He was evaluated in the context of the global COVID-19  pandemic, which necessitated consideration that the patient might be at risk for infection with the SARS-CoV-2 virus that causes COVID-19. Institutional protocols and algorithms that pertain to the evaluation of patients at risk for COVID-19 are in a state of rapid change based on information released by regulatory bodies including the CDC and federal and state organizations. These policies and algorithms were followed during the patient's care in the ED.    Patient keeps changing his story and so is hard to know if he is actually been having any vomiting with blood recently or if that was a week ago.  I did do a rectal exam that had brown stool and was Hemoccult negative.  Just to be safe however we will cover him with Protonix and octreotide while pending work-up.  We will give 1 L of fluid for his tachycardia.  Labs ordered to evaluate for  Electra abnormalities, AKI.  Will keep patient the cardiac monitor due to his tachycardia.  Patient's alcohol level came back significantly elevated.  We will hang patient off to the oncoming team pending labs and reevaluation  Will get CTA to look for any evidence of active bleeding and a CT head given patient is intermittently falling asleep but I suspect that this is most likely just from alcohol abuse.      ____________________________________________   FINAL CLINICAL IMPRESSION(S) / ED DIAGNOSES   Final diagnoses:  Alcohol abuse  Nausea and vomiting, intractability of vomiting not specified, unspecified vomiting type      MEDICATIONS GIVEN DURING THIS VISIT:  Medications  pantoprazole (PROTONIX) injection 40 mg (has no administration in time range)  octreotide (SANDOSTATIN) 2 mcg/mL load via infusion 50 mcg (has no administration in time range)    And  octreotide (SANDOSTATIN) 500 mcg in sodium chloride 0.9 % 250 mL (2 mcg/mL) infusion (has no administration in time range)  sodium chloride 0.9 % bolus 1,000 mL (has no administration in time  range)  ondansetron (ZOFRAN) injection 4 mg (has no administration in time range)     ED Discharge Orders     None        Note:  This document was prepared using Dragon voice recognition software and may include unintentional dictation errors.    Concha Se, MD 06/18/21 2255    Concha Se, MD 06/18/21 [phone]

## 2021-06-19 LAB — CBC WITH DIFFERENTIAL/PLATELET
Abs Immature Granulocytes: 0.01 10*3/uL (ref 0.00–0.07)
Basophils Absolute: 0 10*3/uL (ref 0.0–0.1)
Basophils Relative: 0 %
Eosinophils Absolute: 0 10*3/uL (ref 0.0–0.5)
Eosinophils Relative: 1 %
HCT: 39.6 % (ref 39.0–52.0)
Hemoglobin: 14.8 g/dL (ref 13.0–17.0)
Immature Granulocytes: 0 %
Lymphocytes Relative: 39 %
Lymphs Abs: 2.3 10*3/uL (ref 0.7–4.0)
MCH: 35.2 pg — ABNORMAL HIGH (ref 26.0–34.0)
MCHC: 37.4 g/dL — ABNORMAL HIGH (ref 30.0–36.0)
MCV: 94.1 fL (ref 80.0–100.0)
Monocytes Absolute: 0.5 10*3/uL (ref 0.1–1.0)
Monocytes Relative: 8 %
Neutro Abs: 3.1 10*3/uL (ref 1.7–7.7)
Neutrophils Relative %: 52 %
Platelets: 58 10*3/uL — ABNORMAL LOW (ref 150–400)
RBC: 4.21 MIL/uL — ABNORMAL LOW (ref 4.22–5.81)
RDW: 12.4 % (ref 11.5–15.5)
Smear Review: NORMAL
WBC: 6 10*3/uL (ref 4.0–10.5)
nRBC: 0 % (ref 0.0–0.2)

## 2021-06-19 LAB — URINE DRUG SCREEN, QUALITATIVE (ARMC ONLY)
Amphetamines, Ur Screen: NOT DETECTED
Barbiturates, Ur Screen: NOT DETECTED
Benzodiazepine, Ur Scrn: NOT DETECTED
Cannabinoid 50 Ng, Ur ~~LOC~~: NOT DETECTED
Cocaine Metabolite,Ur ~~LOC~~: NOT DETECTED
MDMA (Ecstasy)Ur Screen: NOT DETECTED
Methadone Scn, Ur: NOT DETECTED
Opiate, Ur Screen: NOT DETECTED
Phencyclidine (PCP) Ur S: NOT DETECTED
Tricyclic, Ur Screen: NOT DETECTED

## 2021-06-19 LAB — URINALYSIS, COMPLETE (UACMP) WITH MICROSCOPIC
Bacteria, UA: NONE SEEN
Bilirubin Urine: NEGATIVE
Glucose, UA: NEGATIVE mg/dL
Hgb urine dipstick: NEGATIVE
Ketones, ur: NEGATIVE mg/dL
Leukocytes,Ua: NEGATIVE
Nitrite: NEGATIVE
Protein, ur: NEGATIVE mg/dL
Specific Gravity, Urine: 1.02 (ref 1.005–1.030)
Squamous Epithelial / LPF: NONE SEEN (ref 0–5)
pH: 7 (ref 5.0–8.0)

## 2021-06-19 LAB — RESP PANEL BY RT-PCR (FLU A&B, COVID) ARPGX2
Influenza A by PCR: NEGATIVE
Influenza B by PCR: NEGATIVE
SARS Coronavirus 2 by RT PCR: NEGATIVE

## 2021-06-19 MED ORDER — LORAZEPAM 2 MG/ML IJ SOLN
0.0000 mg | Freq: Four times a day (QID) | INTRAMUSCULAR | Status: DC
  Filled 2021-06-19: qty 1

## 2021-06-19 MED ORDER — LORAZEPAM 2 MG/ML IJ SOLN
1.0000 mg | Freq: Once | INTRAMUSCULAR | Status: AC
  Administered 2021-06-19: 1 mg via INTRAVENOUS
  Filled 2021-06-19: qty 1

## 2021-06-19 MED ORDER — THIAMINE HCL 100 MG PO TABS
100.0000 mg | ORAL_TABLET | Freq: Every day | ORAL | Status: DC
  Administered 2021-06-19: 100 mg via ORAL
  Filled 2021-06-19: qty 1

## 2021-06-19 MED ORDER — PANTOPRAZOLE SODIUM 40 MG PO TBEC
40.0000 mg | DELAYED_RELEASE_TABLET | Freq: Every day | ORAL | Status: DC
  Administered 2021-06-19: 40 mg via ORAL
  Filled 2021-06-19: qty 1

## 2021-06-19 MED ORDER — PANTOPRAZOLE SODIUM 40 MG PO TBEC: 40.0000 mg | DELAYED_RELEASE_TABLET | Freq: Every day | ORAL | 1 refills | Status: AC

## 2021-06-19 MED ORDER — LORAZEPAM 2 MG PO TABS
0.0000 mg | ORAL_TABLET | Freq: Four times a day (QID) | ORAL | Status: DC
  Administered 2021-06-19: 1 mg via ORAL
  Filled 2021-06-19: qty 1

## 2021-06-19 MED ORDER — AMLODIPINE BESYLATE 5 MG PO TABS
5.0000 mg | ORAL_TABLET | Freq: Every day | ORAL | Status: DC
  Administered 2021-06-19 (×2): 5 mg via ORAL
  Filled 2021-06-19 (×2): qty 1

## 2021-06-19 MED ORDER — LORAZEPAM 2 MG/ML IJ SOLN: 0.0000 mg | Freq: Two times a day (BID) | INTRAMUSCULAR | Status: DC

## 2021-06-19 MED ORDER — LORAZEPAM 2 MG PO TABS: 0.0000 mg | ORAL_TABLET | Freq: Two times a day (BID) | ORAL | Status: DC

## 2021-06-19 MED ORDER — GLIMEPIRIDE 2 MG PO TABS
2.0000 mg | ORAL_TABLET | Freq: Every day | ORAL | Status: DC
  Administered 2021-06-19: 2 mg via ORAL
  Filled 2021-06-19 (×2): qty 1

## 2021-06-19 MED ORDER — AMLODIPINE BESYLATE 5 MG PO TABS: 5.0000 mg | ORAL_TABLET | Freq: Every day | ORAL | 1 refills | Status: AC

## 2021-06-19 MED ORDER — SODIUM CHLORIDE 0.9 % IV SOLN: INTRAVENOUS | Status: AC

## 2021-06-19 MED ORDER — THIAMINE HCL 100 MG/ML IJ SOLN
100.0000 mg | Freq: Every day | INTRAMUSCULAR | Status: DC
  Filled 2021-06-19: qty 2

## 2021-06-19 MED ORDER — BUPROPION HCL ER (XL) 150 MG PO TB24
150.0000 mg | ORAL_TABLET | Freq: Every day | ORAL | Status: DC
  Administered 2021-06-19: 150 mg via ORAL
  Filled 2021-06-19: qty 1

## 2021-06-19 MED ORDER — ONDANSETRON 4 MG PO TBDP: 4.0000 mg | ORAL_TABLET | Freq: Three times a day (TID) | ORAL | Status: DC | PRN

## 2021-06-19 NOTE — Discharge Instructions (Addendum)
No driving. Please have your family take you to [facility] in [place] tonight to get ongoing alcohol treatment.  Steps to find a Primary Care Provider (PCP):  Call [phone] or [phone] to access "[facility] Find a Doctor Service."  2.  You may also go on the [facility] website at [email or web address]

## 2021-06-19 NOTE — ED Notes (Signed)
E-signature pad unavailable - Pt verbalized understanding of D/C information - no additional concerns at this time.  

## 2021-06-19 NOTE — ED Provider Notes (Signed)
  Physical Exam  BP (!) 139/103   Pulse (!) 107   Temp 99 F (37.2 C)   Resp (!) 25   Ht 5' 1.42" (1.56 m)   Wt 70.3 kg   SpO2 94%   BMI 28.89 kg/m   Physical Exam  ED Course/Procedures     Procedures  MDM  12:15 AM  Assumed care at shift change.  Patient is a 40 year old male with history of alcohol abuse who presents to the emergency department with intermittent hematemesis over the past week.  Patient is a very poor historian.  No melena and is guaiac negative here.  Slightly tachycardic which may be due to intoxication but otherwise hemodynamically stable.  Hemoglobin 14.8.  Alcohol level in the 400s.  CTA of the abdomen pelvis showed no sign of active bleeding.  He does have sign of cirrhosis and gallstones.  Abdominal exam is benign.  Will po challenge and continue to monitor for any signs of bleeding.  Patient also requesting detox.  We will place him on CIWA protocol.  12:30 AM  Pt tolerated p.o. well and has not had any further vomiting.  States his last episode of vomiting was about 24 hours ago.  He describes it as seeing about a teaspoonful of blood with his vomit.  He states he has vomited a total of 3 times in the past week.  Denies bloody stool, melena.  Denies abdominal pain.  Still requesting detox.  Denies SI, HI.  I suspect that this could be a Mallory-Weiss tear but I do not think that he has clinically significant bleeding esophageal varices, peptic ulcer, upper GI bleed that warrants medical admission.  We will continue Protonix.  Will consult TTS given he is requesting detox.  3:30 AM  TTS has evaluated the patient and will try to get him to [facility] in the AM.   I reviewed all nursing notes and pertinent previous records as available.  I have reviewed and interpreted any EKGs, lab and urine results, imaging (as available).        Elenor Wildes, Layla Maw, DO 06/19/21 [phone]

## 2021-06-19 NOTE — BH Assessment (Signed)
Referral information for Detox treatment faxed to;   [facility] ( [phone]) Meg reports that beds are available later this morning

## 2021-06-19 NOTE — ED Provider Notes (Signed)
Notified that patient has a treatment bed at [facility] but he is currently awaiting discussion with his family to assure that if he goes there he did have a ride home after completing treatment.  Calvin from our TTS team and stop I am informing of this.  I went and saw the patient himself he has some mild tremulousness, discussed with him and vital signs are stable his CIWA score approximating 5.  He would like a little additional treatment for his shakes and he is fully awake and alert at this time but shows tremulousness in both upper extremities.  Evidently ordered 1 mg of IV Ativan for ongoing alcohol withdrawal treatment at this time.  Patient agreeable   Sharyn Creamer, MD 06/19/21 [phone]

## 2021-06-19 NOTE — ED Provider Notes (Signed)
Vitals:   06/19/21 1254 06/19/21 1700  BP: (!) 126/96 (!) 136/97  Pulse: 84 81  Resp: 16   Temp:    SpO2: 97% 98%     Patient resting in no distress.  Fully alert without difficulty.  He is understanding and comfortable with the plan for discharge and going direct to [facility] in [facility].  Patient reports his mother will be here just after 7 PM to pick him up and then they will be going to [facility] for ongoing outpatient alcohol abuse therapy.  He knows not to drive is fully awake and alert and expresses plan to go direct to treatment with his mother driving him.  Patient appropriate for discharge with anticipated plan that he will present to [facility] for ongoing treatment    Sharyn Creamer, MD 06/19/21 1853

## 2021-06-19 NOTE — BH Assessment (Signed)
Comprehensive Clinical Assessment (CCA) Note  06/19/2021 Todd Grant [id number]  Chief Complaint: Patient is a 40 year old male presenting to [facility] ED voluntarily seeking detox treatment. Patient reports that he has been drinking daily for the past 2 weeks "at least 12 beers sometimes more every day." Patient reports his age of first use at 39 years old. Patient reports current withdrawal symptoms "vomiting, chills, sweats, shaking, anxiety." Patient reports he last drank "last night" and reports drinking 3 40oz beers during that time. Patient denies SI/HI/AH/VH and does not appear to be responding to any internal or external stimuli.  Chief Complaint  Patient presents with   Hematemesis   Visit Diagnosis: Alcohol Use Disorder, severe    CCA Screening, Triage and Referral (STR)  Patient Reported Information How did you hear about [place]? Self  Referral name: No data recorded Referral phone number: No data recorded  Whom do you see for routine medical problems? No data recorded Practice/Facility Name: No data recorded Practice/Facility Phone Number: No data recorded Name of Contact: No data recorded Contact Number: No data recorded Contact Fax Number: No data recorded Prescriber Name: No data recorded Prescriber Address (if known): No data recorded  What Is the Reason for Your Visit/Call Today? Patient here voluntarily for detox treatment  How Long Has This Been Causing You Problems? > than 6 months  What Do You Feel Would Help You the Most Today? Alcohol or Drug Use Treatment   Have You Recently Been in Any Inpatient Treatment (Hospital/Detox/Crisis Center/28-Day Program)? No data recorded Name/Location of Program/Hospital:No data recorded How Long Were You There? No data recorded When Were You Discharged? No data recorded  Have You Ever Received Services From [facility] Before? No data recorded Who Do You See at [facility]? No data recorded  Have You Recently Had  Any Thoughts About Hurting Yourself? No  Are You Planning to Commit Suicide/Harm Yourself At This time? No   Have you Recently Had Thoughts About Hurting Someone Karolee Ohs? No  Explanation: No data recorded  Have You Used Any Alcohol or Drugs in the Past 24 Hours? Yes  How Long Ago Did You Use Drugs or Alcohol? No data recorded What Did You Use and How Much? Alcohol, 3 40oz beers   Do You Currently Have a Therapist/Psychiatrist? No  Name of Therapist/Psychiatrist: No data recorded  Have You Been Recently Discharged From Any Office Practice or Programs? No  Explanation of Discharge From Practice/Program: No data recorded    CCA Screening Triage Referral Assessment Type of Contact: Face-to-Face  Is this Initial or Reassessment? No data recorded Date Telepsych consult ordered in [facility]:  No data recorded Time Telepsych consult ordered in [facility]:  No data recorded  Patient Reported Information Reviewed? No data recorded Patient Left Without Being Seen? No data recorded Reason for Not Completing Assessment: No data recorded  Collateral Involvement: No data recorded  Does Patient Have a Court Appointed Legal Guardian? No data recorded Name and Contact of Legal Guardian: No data recorded If Minor and Not Living with Parent(s), Who has Custody? No data recorded Is CPS involved or ever been involved? Never  Is APS involved or ever been involved? Never   Patient Determined To Be At Risk for Harm To Self or Others Based on Review of Patient Reported Information or Presenting Complaint? No  Method: No data recorded Availability of Means: No data recorded Intent: No data recorded Notification Required: No data recorded Additional Information for Danger to Others Potential: No  data recorded Additional Comments for Danger to Others Potential: No data recorded Are There Guns or Other Weapons in Your Home? No data recorded Types of Guns/Weapons: No data recorded Are These Weapons Safely  Secured?                            No data recorded Who Could Verify You Are Able To Have These Secured: No data recorded Do You Have any Outstanding Charges, Pending Court Dates, Parole/Probation? No data recorded Contacted To Inform of Risk of Harm To Self or Others: No data recorded  Location of Assessment: [facility] ED   Does Patient Present under Involuntary Commitment? No  IVC Papers Initial File Date: No data recorded  [place] of Residence: [place]   Patient Currently Receiving the Following Services: No data recorded  Determination of Need: Emergent (2 hours)   Options For Referral: No data recorded    CCA Biopsychosocial Intake/Chief Complaint:  No data recorded Current Symptoms/Problems: No data recorded  Patient Reported Schizophrenia/Schizoaffective Diagnosis in Past: No   Strengths: Patient is able to communicate  Preferences: No data recorded Abilities: No data recorded  Type of Services Patient Feels are Needed: No data recorded  Initial Clinical Notes/Concerns: No data recorded  Mental Health Symptoms Depression:   None   Duration of Depressive symptoms: No data recorded  Mania:   None   Anxiety:    Tension; Sleep; Difficulty concentrating   Psychosis:   None   Duration of Psychotic symptoms: No data recorded  Trauma:   None   Obsessions:   None   Compulsions:   None   Inattention:   None   Hyperactivity/Impulsivity:   None   Oppositional/Defiant Behaviors:   None   Emotional Irregularity:   None   Other Mood/Personality Symptoms:  No data recorded   Mental Status Exam Appearance and self-care  Stature:   Average   Weight:   Average weight   Clothing:   Casual   Grooming:   Normal   Cosmetic use:   None   Posture/gait:   Normal   Motor activity:   Not Remarkable   Sensorium  Attention:   Normal   Concentration:   Normal   Orientation:   X5   Recall/memory:   Normal   Affect and Mood  Affect:    Appropriate   Mood:   Anxious   Relating  Eye contact:   Normal   Facial expression:   Responsive   Attitude toward examiner:   Cooperative   Thought and Language  Speech flow:  Clear and Coherent   Thought content:   Appropriate to Mood and Circumstances   Preoccupation:   None   Hallucinations:   None   Organization:  No data recorded  [employer] of Knowledge:   Fair   Intelligence:   Average   Abstraction:   Normal   Judgement:   Good   Reality Testing:   Realistic   Insight:   Good   Decision Making:   Normal   Social Functioning  Social Maturity:   Responsible   Social Judgement:   Normal   Stress  Stressors:   Office manager Ability:   Normal   Skill Deficits:   None   Supports:   Family     Religion: Religion/Spirituality Are You A Religious Person?: No  Leisure/Recreation: Leisure / Recreation Do You Have Hobbies?: No  Exercise/Diet: Exercise/Diet Do  You Exercise?: No Have You Gained or Lost A Significant Amount of Weight in the Past Six Months?: No Do You Follow a Special Diet?: No Do You Have Any Trouble Sleeping?: Yes Explanation of Sleeping Difficulties: Patient reports difficulty sleeping   CCA Employment/Education Employment/Work Situation: Employment / Work Situation Employment Situation: Unemployed Patient's Job has Been Impacted by Current Illness: No Has Patient ever Been in Equities trader?: No  Education: Education Did Theme park manager?: No Did You Have An Individualized Education Program (IIEP): No Did You Have Any Difficulty At [employer]?: No Patient's Education Has Been Impacted by Current Illness: No   CCA Family/Childhood History Family and Relationship History: Family history Marital status: Single Does patient have children?: No  Childhood History:  Childhood History Did patient suffer any verbal/emotional/physical/sexual abuse as a child?: No Did patient  suffer from severe childhood neglect?: No Has patient ever been sexually abused/assaulted/raped as an adolescent or adult?: No Was the patient ever a victim of a crime or a disaster?: No Witnessed domestic violence?: No Has patient been affected by domestic violence as an adult?: No  Child/Adolescent Assessment:     CCA Substance Use Alcohol/Drug Use: Alcohol / Drug Use Pain Medications: See MAR Prescriptions: See MAR Over the Counter: See MAR History of alcohol / drug use?: Yes Withdrawal Symptoms: Fever / Chills, Nausea / Vomiting, Sweats Substance #1 Name of Substance 1: Alcohol 1 - Age of First Use: 13 1 - Amount (size/oz): 12 beers 1 - Frequency: daily 1 - Last Use / Amount: 06/18/21 1- Route of Use: Oral                       ASAM's:  Six Dimensions of Multidimensional Assessment  Dimension 1:  Acute Intoxication and/or Withdrawal Potential:      Dimension 2:  Biomedical Conditions and Complications:      Dimension 3:  Emotional, Behavioral, or Cognitive Conditions and Complications:     Dimension 4:  Readiness to Change:     Dimension 5:  Relapse, Continued use, or Continued Problem Potential:     Dimension 6:  Recovery/Living Environment:     ASAM Severity Score:    ASAM Recommended Level of Treatment: ASAM Recommended Level of Treatment: Level III Residential Treatment   Substance use Disorder (SUD) Substance Use Disorder (SUD)  Checklist Symptoms of Substance Use: Continued use despite having a persistent/recurrent physical/psychological problem caused/exacerbated by use, Evidence of tolerance, Evidence of withdrawal (Comment), Continued use despite persistent or recurrent social, interpersonal problems, caused or exacerbated by use, Large amounts of time spent to obtain, use or recover from the substance(s), Persistent desire or unsuccessful efforts to cut down or control use, Presence of craving or strong urge to use, Recurrent use that results in a  failure to fulfill major role obligations (work, school, home), Social, occupational, recreational activities given up or reduced due to use, Substance(s) often taken in larger amounts or over longer times than was intended, Repeated use in physically hazardous situations  Recommendations for Services/Supports/Treatments: Recommendations for Services/Supports/Treatments Recommendations For Services/Supports/Treatments: Detox  DSM5 Diagnoses: There are no problems to display for this patient.   Patient Centered Plan: Patient is on the following Treatment Plan(s):  Substance Abuse   Referrals to Alternative Service(s): Referred to Alternative Service(s):   Place:   Date:   Time:    Referred to Alternative Service(s):   Place:   Date:   Time:    Referred to Alternative Service(s):   Place:  Date:   Time:    Referred to Alternative Service(s):   Place:   Date:   Time:     Dann Ventress A Taqwa Deem, LCAS-A

## 2021-06-19 NOTE — ED Notes (Signed)
Patient sitting on the side of the bed watching something on his cell phone. Denies any needs at this time, reminded of importance of keeping monitoring equipment on.Denies nausea, slight anxiety.

## 2021-06-19 NOTE — ED Notes (Signed)
Pt endorses hematemesis and hematochezia for the last week with his last episode of vomiting being this AM. Hx of ETOH and his last drink was this AM - drink of choice is beer.

## 2021-06-19 NOTE — ED Notes (Signed)
PO challenge with water tolerated by patient

## 2021-06-19 NOTE — BH Assessment (Signed)
Spoke with patient again about [facility]. He spoke with family and they are going to transport him there. He was giving the address and Phone number.  ER MD (Dr. Fanny Bien) is aware.

## 2021-06-19 NOTE — ED Notes (Addendum)
error 

## 2021-06-19 NOTE — BH Assessment (Signed)
Spoke with [facility] ([phone]), patient was declined due to having blood in his vomiting.  Patient referred to; [facility], [facility] and [facility].  Patient obtained a bed offer with [facility] ([phone]). Patient was made aware. He is attempting to contact family to arrange transportation back from the facility. Writer updated ER MD (Dr. Erma Heritage).

## 2021-11-19 ENCOUNTER — Encounter: Payer: Self-pay | Admitting: Internal Medicine

## 2022-08-11 IMAGING — CT CT CTA ABD/PEL W/CM AND/OR W/O CM
3 of 10 series · 11 of 46 positions shown, 17 images · IV contrast (omnipaque)
Comparison: None.

CLINICAL DATA: Hematemesis detox

EXAM:
CTA ABDOMEN AND PELVIS WITHOUT AND WITH CONTRAST
TECHNIQUE: Multidetector CT imaging of the abdomen and pelvis was performed
using the standard protocol during bolus administration of
intravenous contrast. Multiplanar reconstructed images and MIPs were
obtained and reviewed to evaluate the vascular anatomy.
CONTRAST:  100mL OMNIPAQUE IOHEXOL 350 MG/ML SOLN

[Series 6: axial arterial · axial · arterial · 0.79mm/px · z∈[-854,-612]mm · 5 of 244 slices shown]
[im 18/244  soft-tissue]
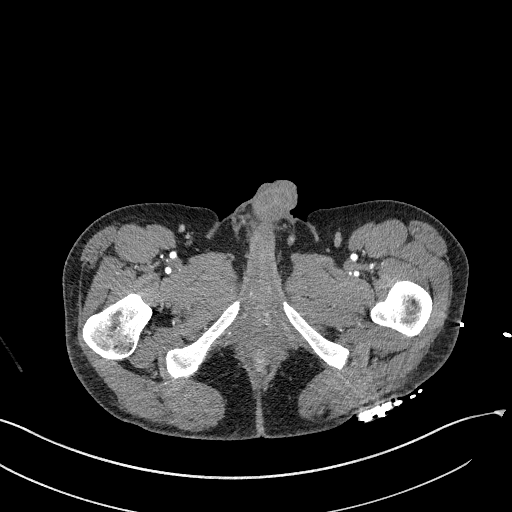
[im 53/244  soft-tissue]
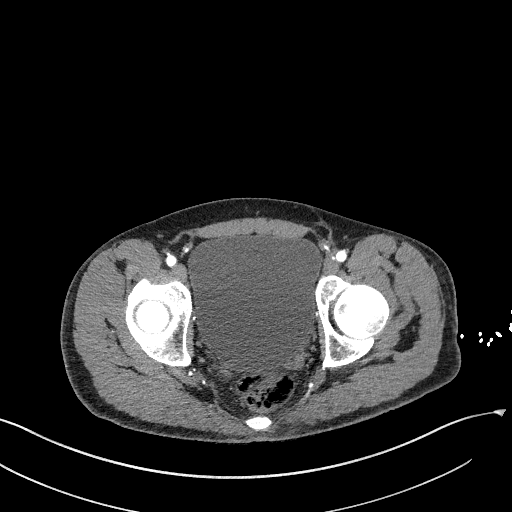
[im 87/244  soft-tissue]
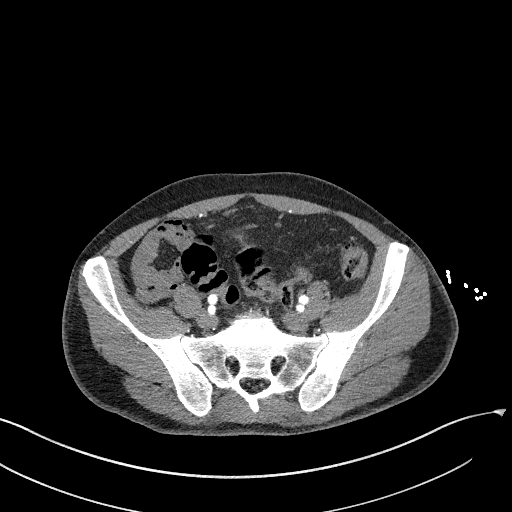
[im 105/244  soft-tissue]
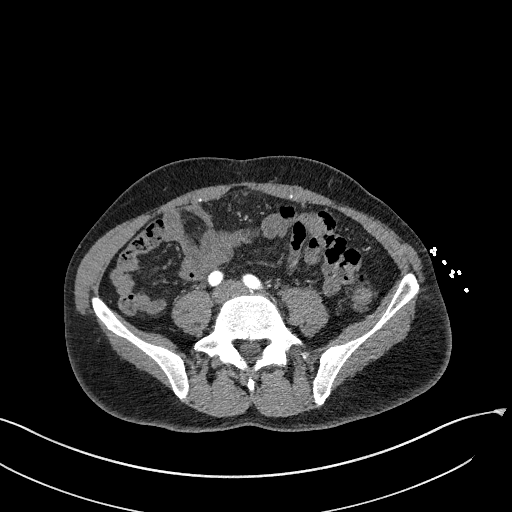
[im 139/244  soft-tissue]
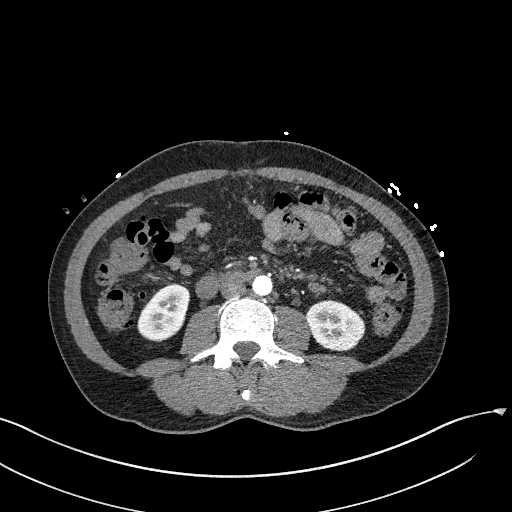

[Series 7: axial venous · axial · portal-venous · 0.79mm/px · z∈[-792,-502]mm · 4 of 98 slices shown, 9 images]
[im 20/98  soft-tissue]
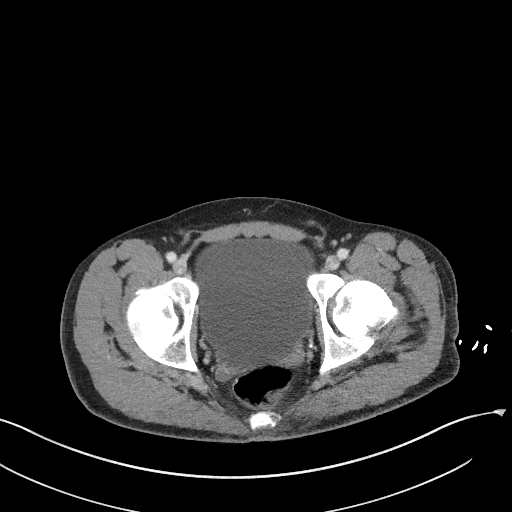
[im 20/98  lung]
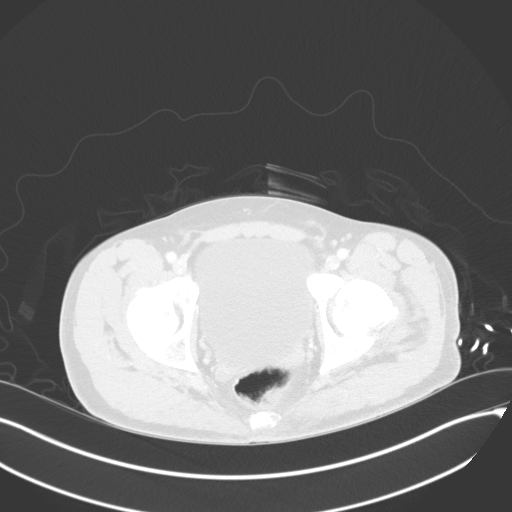
[im 20/98  bone]
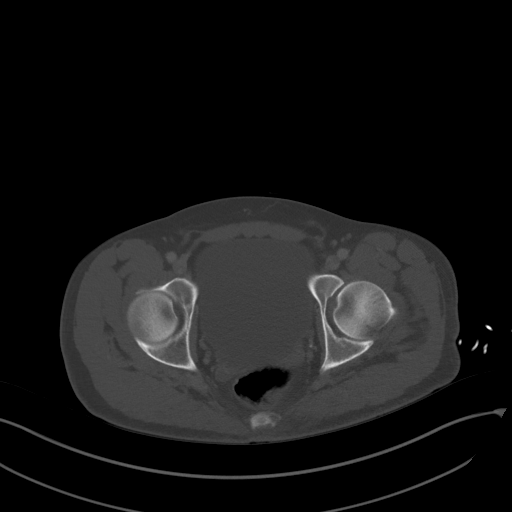
[im 39/98  soft-tissue]
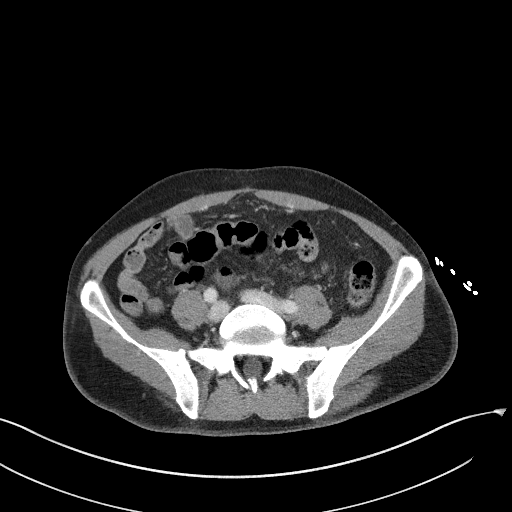
[im 39/98  lung]
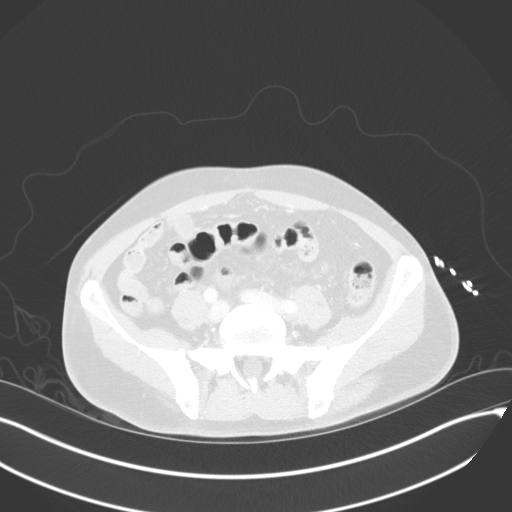
[im 59/98  soft-tissue]
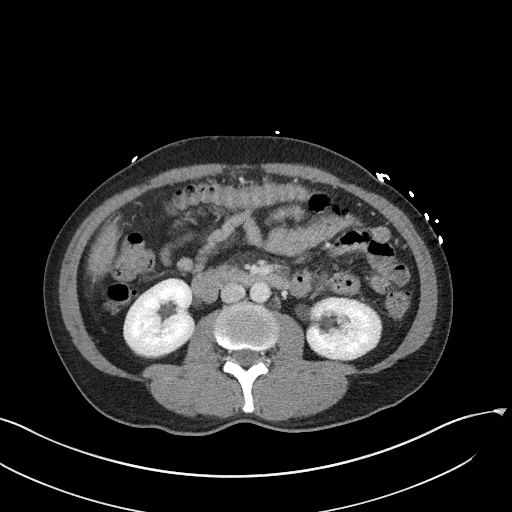
[im 59/98  lung]
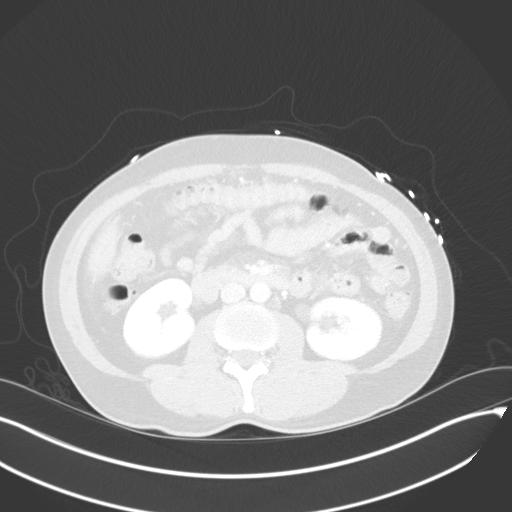
[im 78/98  soft-tissue]
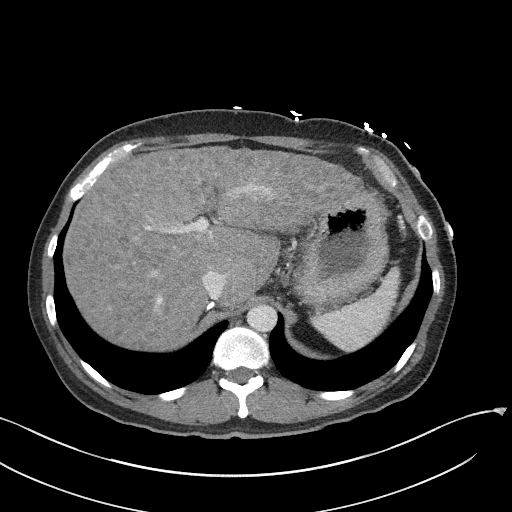
[im 78/98  lung]
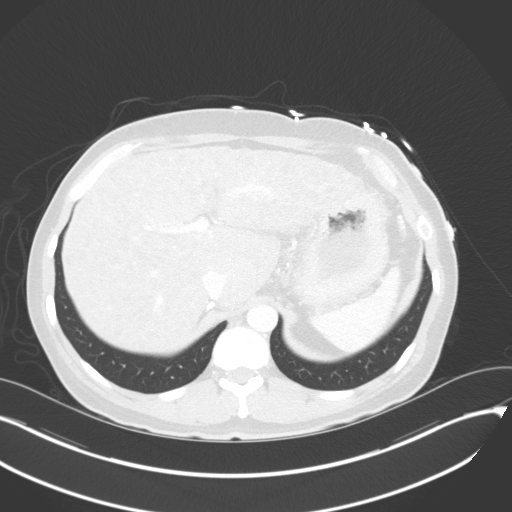

[Series 10: coronal mpr · coronal · 0.72mm/px · 2 of 118 slices shown, 3 images]
[im 40/118  soft-tissue]
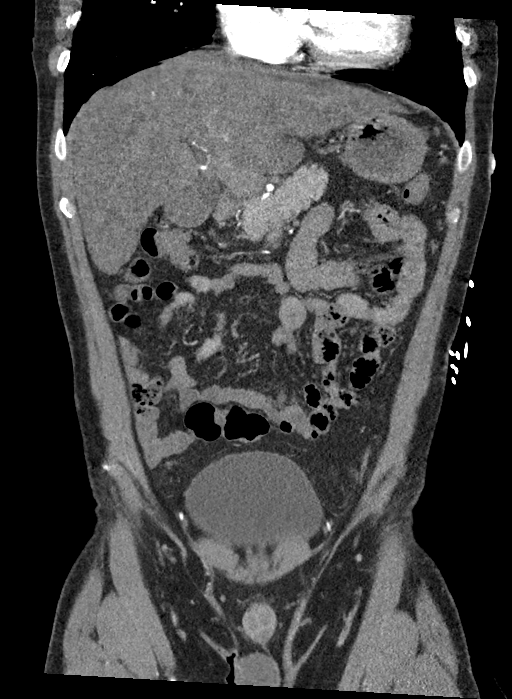
[im 40/118  bone]
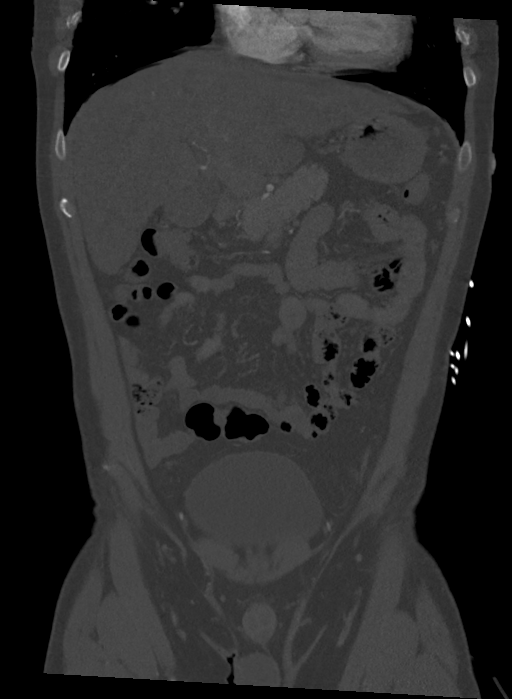
[im 79/118  soft-tissue]
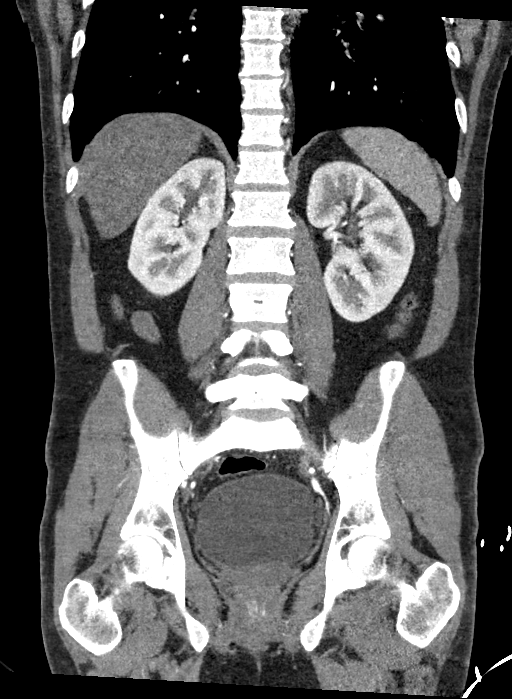

[11 of 46 positions shown; findings below may reference images not displayed]

FINDINGS: VASCULAR

Aorta: Non contrasted images demonstrate no acute intramural
hematoma. Aorta is nonaneurysmal. No dissection is seen. No
significant stenosis or occlusive disease

Celiac: Patent without evidence of aneurysm, dissection, vasculitis
or significant stenosis.

SMA: Patent without evidence of aneurysm, dissection, vasculitis or
significant stenosis.

Renals: Single right and single left renal arteries. No significant
stenosis or occlusive disease. No dissection or aneurysm

IMA: Patent without evidence of aneurysm, dissection, vasculitis or
significant stenosis.

Inflow: Patent without evidence of aneurysm, dissection, vasculitis
or significant stenosis.

Proximal Outflow: Bilateral common femoral and visualized portions
of the superficial and profunda femoral arteries are patent without
evidence of aneurysm, dissection, vasculitis or significant
stenosis.

Review of the MIP images confirms the above findings.

NON-VASCULAR

Lower chest: Lung bases are clear.  Normal cardiac size.

Hepatobiliary: Hepatic steatosis with diffuse nodularity of hepatic
parenchyma. Gallstones. No biliary dilatation

Pancreas: Unremarkable. No pancreatic ductal dilatation or
surrounding inflammatory changes.

Spleen: Normal in size without focal abnormality.

Adrenals/Urinary Tract: Calcifications in the right adrenal gland
without mass, likely due to prior insult. Left adrenal gland is
normal. Kidneys show no hydronephrosis. The bladder is normal.

Stomach/Bowel: Stomach is within normal limits. Appendix appears
normal. No evidence of bowel wall thickening, distention, or
inflammatory changes.

Lymphatic: No suspicious adenopathy.

Reproductive: Prostate is unremarkable.

Other: Negative for free air or free fluid.

Musculoskeletal: No acute or significant osseous findings.
IMPRESSION: VASCULAR

1. Negative for extravasation/active GI bleeding at this time.
2. No significant vascular disease within the abdomen or pelvis.

NON-VASCULAR

1. Liver cirrhosis with diffuse nodularity of the hepatic
parenchyma. When the patient is clinically stable and able to follow
directions and hold their breath (preferably as an outpatient)
further evaluation with dedicated abdominal MRI should be
considered.
2. Gallstones

## 2022-08-11 IMAGING — CT CT HEAD W/O CM
3 series · 15 of 47 positions shown, 18 images · non-contrast
Comparison: None.

CLINICAL DATA: Mental status change detox

EXAM:
CT HEAD WITHOUT CONTRAST
TECHNIQUE: Contiguous axial images were obtained from the base of the skull
through the vertex without intravenous contrast.

[Series 2: head wo · axial · 0.42mm/px · z∈[-111,+14]mm · 9 of 31 slices shown, 12 images]
[im 3/31  brain]
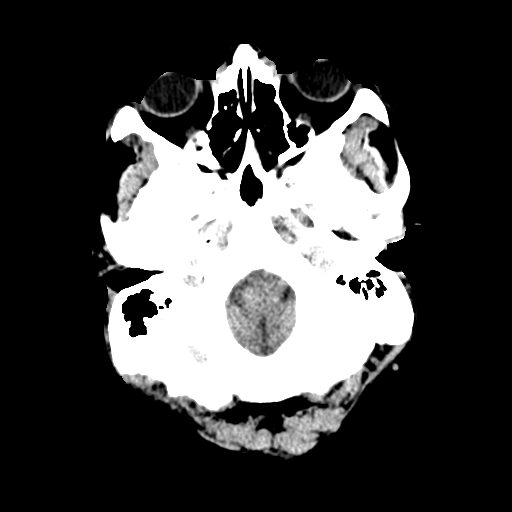
[im 3/31  bone]
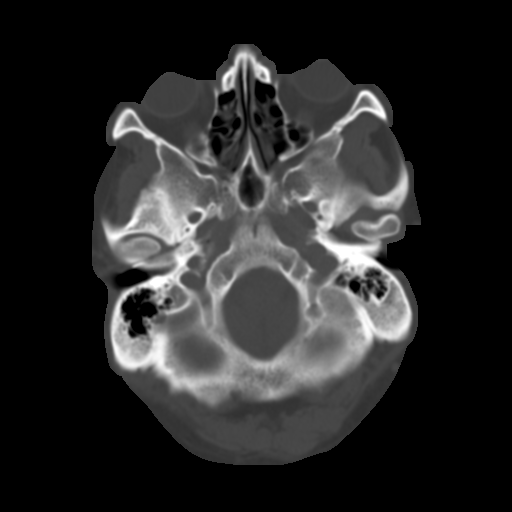
[im 6/31  brain]
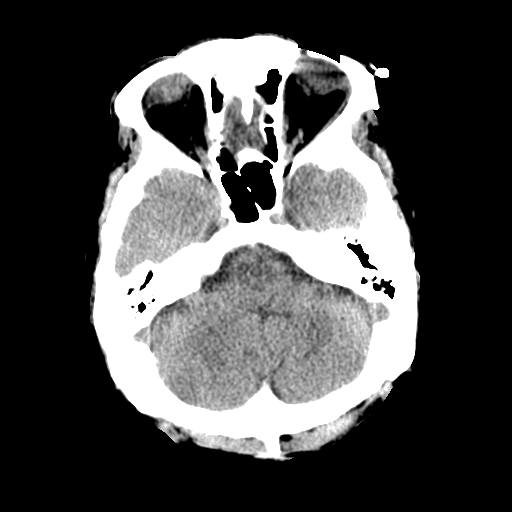
[im 9/31  brain]
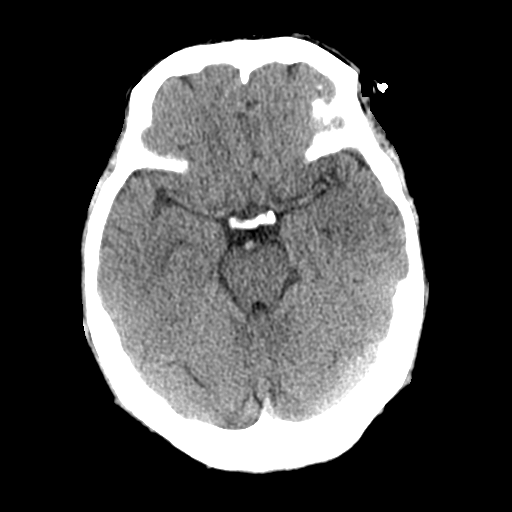
[im 12/31  brain]
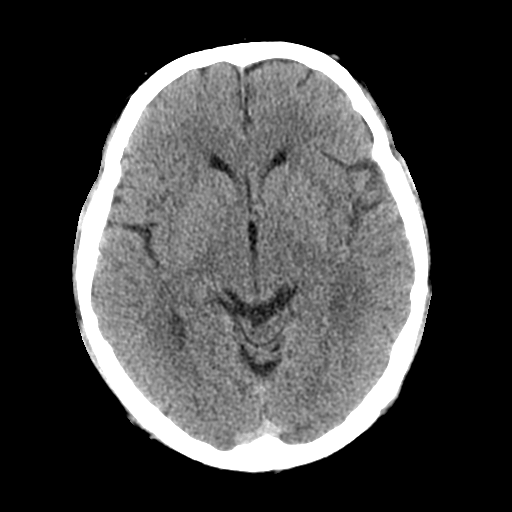
[im 16/31  brain]
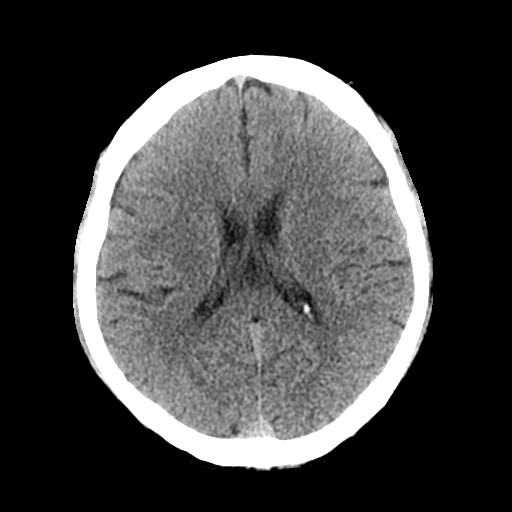
[im 16/31  bone]
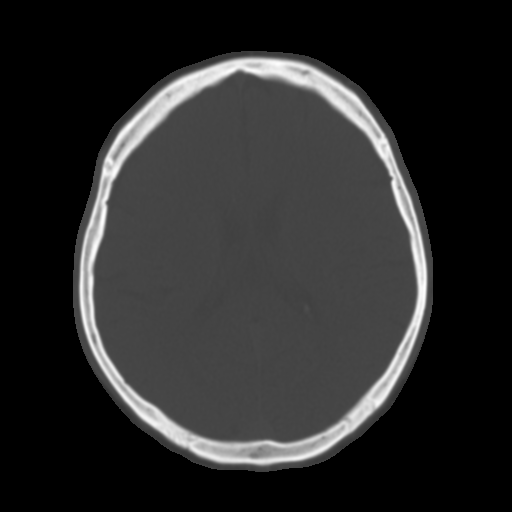
[im 19/31  brain]
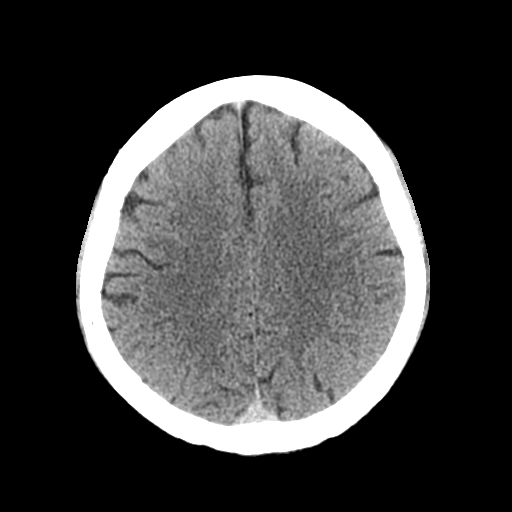
[im 22/31  brain]
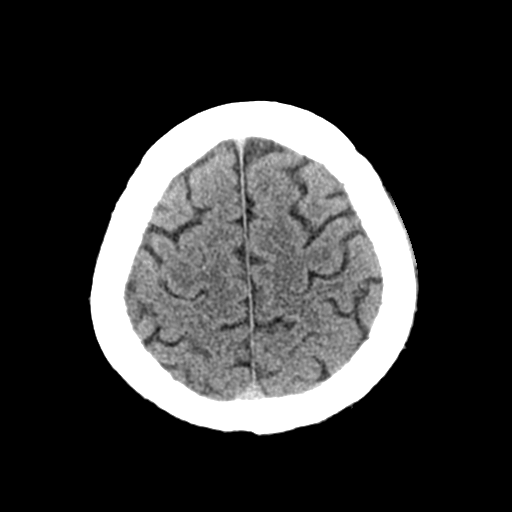
[im 25/31  brain]
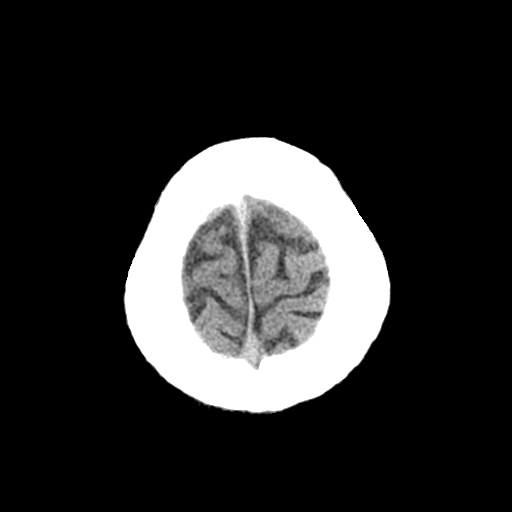
[im 28/31  brain]
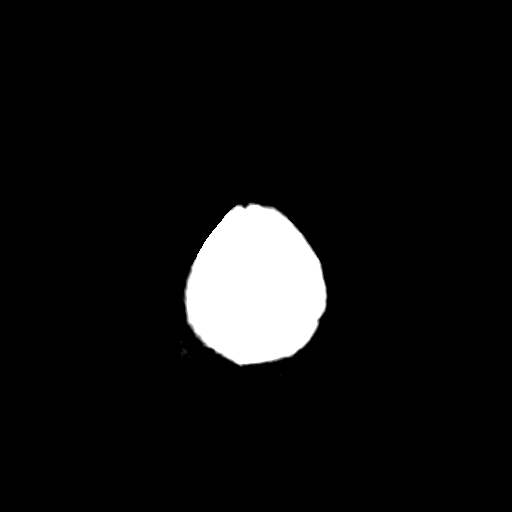
[im 28/31  bone]
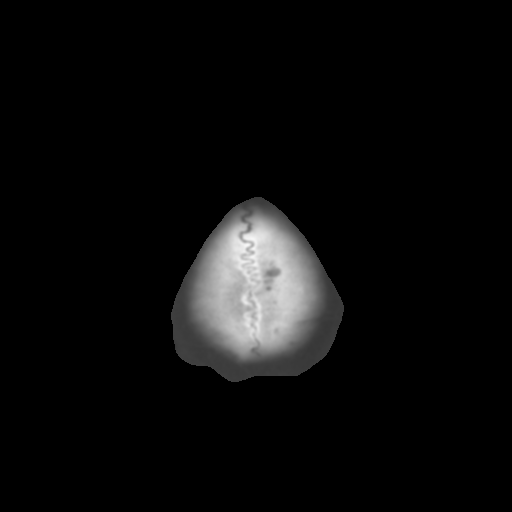

[Series 4: coronal soft tissue · coronal · 0.30mm/px · 3 of 63 slices shown]
[im 21/63  brain]
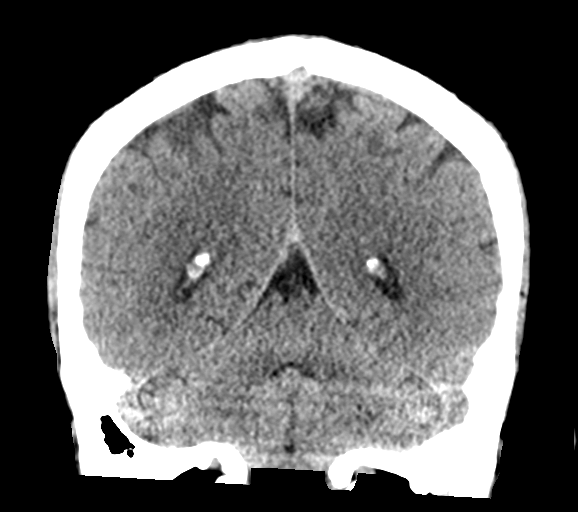
[im 28/63  brain]
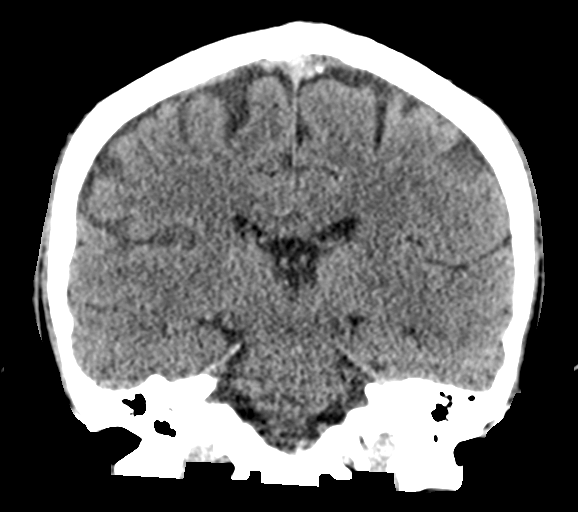
[im 35/63  brain]
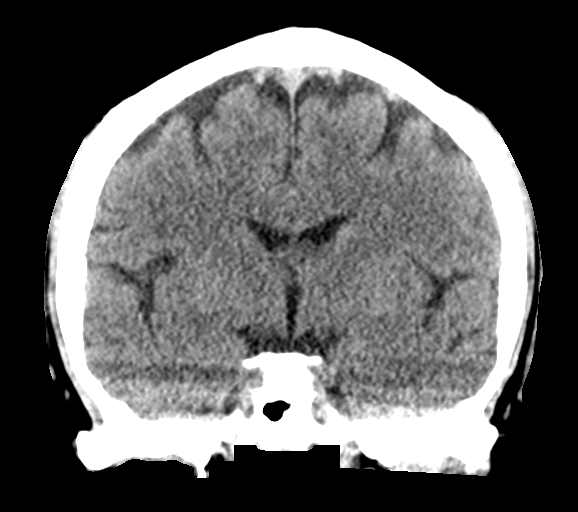

[Series 5: sagittal soft tissue · sagittal · 0.30mm/px · 3 of 56 slices shown]
[im 19/56  brain]
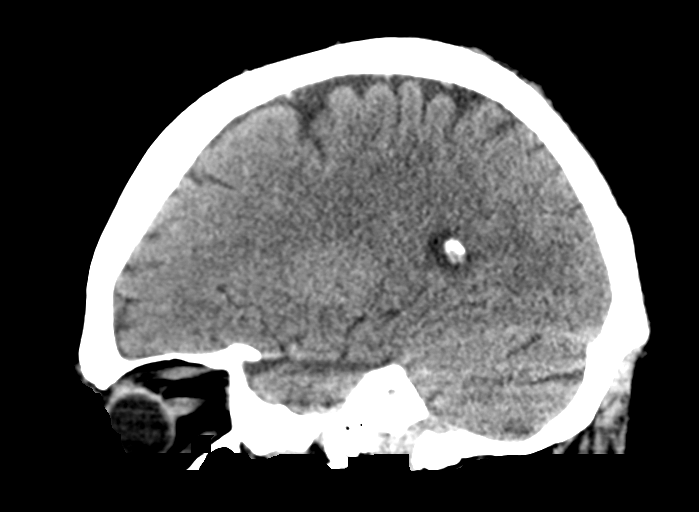
[im 28/56  brain]
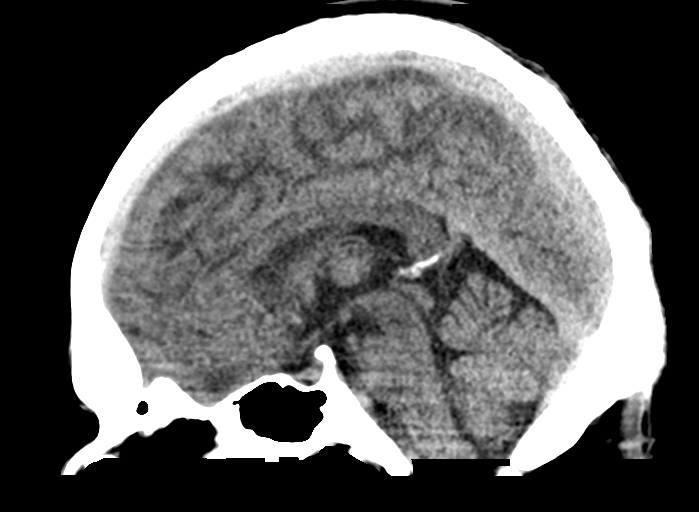
[im 37/56  brain]
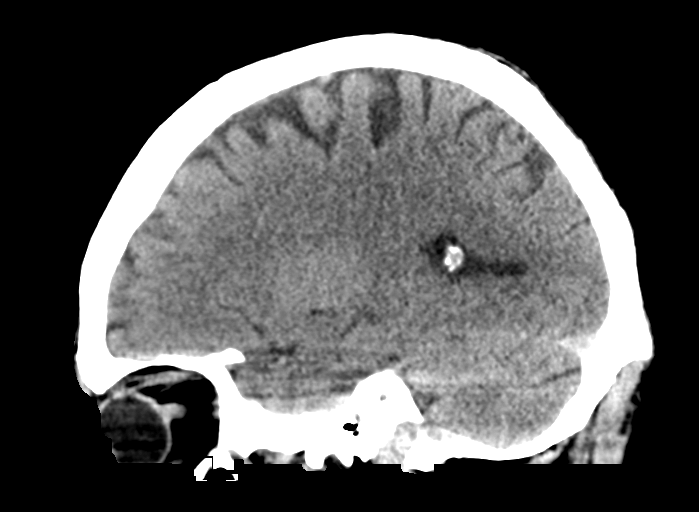

[15 of 47 positions shown; findings below may reference images not displayed]

FINDINGS: Brain: No evidence of acute infarction, hemorrhage, hydrocephalus,
extra-axial collection or mass lesion/mass effect.

Vascular: No hyperdense vessel or unexpected calcification.

Skull: Normal. Negative for fracture or focal lesion.

Sinuses/Orbits: No acute finding.

Other: None
IMPRESSION: Negative non contrasted CT appearance of the brain
